# Patient Record
Sex: Female | Born: 1975
Health system: Southern US, Community
[De-identification: ages and names within clinical notes are randomized; demographics above are authoritative.]

## PROBLEM LIST (undated history)

## (undated) DIAGNOSIS — Z85828 Personal history of other malignant neoplasm of skin: Secondary | ICD-10-CM

## (undated) DIAGNOSIS — N926 Irregular menstruation, unspecified: Secondary | ICD-10-CM

## (undated) DIAGNOSIS — N879 Dysplasia of cervix uteri, unspecified: Secondary | ICD-10-CM

## (undated) DIAGNOSIS — C801 Malignant (primary) neoplasm, unspecified: Secondary | ICD-10-CM

## (undated) HISTORY — DX: Irregular menstruation, unspecified: N92.6

## (undated) HISTORY — DX: Personal history of other malignant neoplasm of skin: Z85.828

## (undated) HISTORY — DX: Dysplasia of cervix uteri, unspecified: N87.9

---

## 2000-05-22 ENCOUNTER — Encounter: Payer: Self-pay | Admitting: Physical Medicine and Rehabilitation

## 2000-05-22 ENCOUNTER — Ambulatory Visit (HOSPITAL_COMMUNITY): Admission: RE | Admit: 2000-05-22 | Discharge: 2000-05-22 | Payer: Self-pay | Admitting: Neurosurgery

## 2008-09-17 HISTORY — PX: APPENDECTOMY: SHX54

## 2008-09-26 ENCOUNTER — Ambulatory Visit: Payer: Self-pay | Admitting: Internal Medicine

## 2008-09-26 ENCOUNTER — Inpatient Hospital Stay: Payer: Self-pay | Admitting: Surgery

## 2010-02-08 DIAGNOSIS — C439 Malignant melanoma of skin, unspecified: Secondary | ICD-10-CM

## 2010-02-08 HISTORY — DX: Malignant melanoma of skin, unspecified: C43.9

## 2010-02-17 HISTORY — PX: MELANOMA EXCISION: SHX5266

## 2010-04-17 ENCOUNTER — Encounter: Payer: Self-pay | Admitting: Family Medicine

## 2010-04-19 HISTORY — PX: COLPOSCOPY: SHX161

## 2010-05-09 ENCOUNTER — Encounter: Payer: Self-pay | Admitting: Family Medicine

## 2010-05-10 DIAGNOSIS — D239 Other benign neoplasm of skin, unspecified: Secondary | ICD-10-CM

## 2010-05-10 HISTORY — DX: Other benign neoplasm of skin, unspecified: D23.9

## 2010-05-15 ENCOUNTER — Ambulatory Visit: Payer: Self-pay | Admitting: Family Medicine

## 2010-05-15 ENCOUNTER — Ambulatory Visit
Admission: RE | Admit: 2010-05-15 | Discharge: 2010-05-15 | Payer: Self-pay | Source: Home / Self Care | Attending: Family Medicine | Admitting: Family Medicine

## 2010-05-15 DIAGNOSIS — Z85828 Personal history of other malignant neoplasm of skin: Secondary | ICD-10-CM | POA: Insufficient documentation

## 2010-05-15 DIAGNOSIS — R21 Rash and other nonspecific skin eruption: Secondary | ICD-10-CM | POA: Insufficient documentation

## 2010-05-15 DIAGNOSIS — M533 Sacrococcygeal disorders, not elsewhere classified: Secondary | ICD-10-CM | POA: Insufficient documentation

## 2010-05-17 ENCOUNTER — Ambulatory Visit
Admission: RE | Admit: 2010-05-17 | Discharge: 2010-05-17 | Payer: Self-pay | Source: Home / Self Care | Attending: Family Medicine | Admitting: Family Medicine

## 2010-05-17 DIAGNOSIS — Z8742 Personal history of other diseases of the female genital tract: Secondary | ICD-10-CM | POA: Insufficient documentation

## 2010-05-21 LAB — CONVERTED CEMR LAB
ALT: 14 units/L (ref 0–35)
AST: 23 units/L (ref 0–37)
Albumin: 4.2 g/dL (ref 3.5–5.2)
Alkaline Phosphatase: 70 units/L (ref 39–117)
BUN: 14 mg/dL (ref 6–23)
Basophils Absolute: 0.1 10*3/uL (ref 0.0–0.1)
Basophils Relative: 1.3 % (ref 0.0–3.0)
Bilirubin, Direct: 0.1 mg/dL (ref 0.0–0.3)
CO2: 28 meq/L (ref 19–32)
Calcium: 9.3 mg/dL (ref 8.4–10.5)
Chloride: 102 meq/L (ref 96–112)
Cholesterol: 166 mg/dL (ref 0–200)
Creatinine, Ser: 0.9 mg/dL (ref 0.4–1.2)
Eosinophils Absolute: 0.4 10*3/uL (ref 0.0–0.7)
Eosinophils Relative: 6.8 % — ABNORMAL HIGH (ref 0.0–5.0)
GFR calc non Af Amer: 76.26 mL/min (ref >60.00)
Glucose, Bld: 84 mg/dL (ref 70–99)
HCT: 39 % (ref 36.0–46.0)
HDL: 53.4 mg/dL (ref >39.00)
Hemoglobin: 13.4 g/dL (ref 12.0–15.0)
LDL Cholesterol: 101 mg/dL — ABNORMAL HIGH (ref 0–99)
Lymphocytes Relative: 25.7 % (ref 12.0–46.0)
Lymphs Abs: 1.6 10*3/uL (ref 0.7–4.0)
MCHC: 34.2 g/dL (ref 30.0–36.0)
MCV: 93.3 fL (ref 78.0–100.0)
Monocytes Absolute: 0.5 10*3/uL (ref 0.1–1.0)
Monocytes Relative: 8 % (ref 3.0–12.0)
Neutro Abs: 3.6 10*3/uL (ref 1.4–7.7)
Neutrophils Relative %: 58.2 % (ref 43.0–77.0)
Platelets: 225 10*3/uL (ref 150.0–400.0)
Potassium: 4.2 meq/L (ref 3.5–5.1)
RBC: 4.18 M/uL (ref 3.87–5.11)
RDW: 13.1 % (ref 11.5–14.6)
Sodium: 139 meq/L (ref 135–145)
TSH: 1.15 microintl units/mL (ref 0.35–5.50)
Total Bilirubin: 0.7 mg/dL (ref 0.3–1.2)
Total CHOL/HDL Ratio: 3
Total Protein: 7.3 g/dL (ref 6.0–8.3)
Triglycerides: 58 mg/dL (ref 0.0–149.0)
VLDL: 11.6 mg/dL (ref 0.0–40.0)
WBC: 6.2 10*3/uL (ref 4.5–10.5)

## 2010-06-05 ENCOUNTER — Encounter: Payer: Self-pay | Admitting: Family Medicine

## 2010-06-21 NOTE — Assessment & Plan Note (Signed)
Summary: FOLLOW UP BEFORE END OF YEAR/RBH   Vital Signs:  Patient profile:   35 year old female Height:      61 inches Weight:      129 pounds BMI:     24.46 Temp:     98.4 degrees F oral Pulse rate:   72 / minute Pulse rhythm:   regular BP sitting:   108 / 60  (left arm) Cuff size:   regular  Vitals Entered By: Mervin Hack CMA Duncan Dull) (May 17, 2010 11:59 AM) CC: follow-up visit   History of Present Illness: here for follow up to review wellness labs and x ray  did coccyx x ray at last visit- normal  still sore from injury - never had a cyst or other skin change  is sore to sit for a long time  does not bother her enough to take med  slowly improves over time   referred to allergist for chronic skin rash-- will see Dr Burnard Hawthorne in [place]  still has a lot of itching  does keep nails cut very short  did take hydroxyzine last night    good wellness profile  lipids trig 58 and HDL 53 and LDL 101   saw gyn in nov-- colp was done/ has hpv had some dysplasia / pre cancerous cells  will be planning a leep on jan 20th        Allergies: 1)  ! Reglan  Past History:  Past Medical History: long cycle - menses every 6 weeks   Review of Systems General:  Denies fatigue, loss of appetite, and malaise. Eyes:  Denies blurring and eye irritation. CV:  Denies chest pain or discomfort, lightheadness, palpitations, and shortness of breath with exertion. Resp:  Denies cough, shortness of breath, and wheezing. GI:  Denies abdominal pain, change in bowel habits, indigestion, and nausea. GU:  Denies abnormal vaginal bleeding, dysuria, and urinary frequency. MS:  Denies joint pain, joint redness, joint swelling, and muscle weakness. Derm:  Complains of itching and rash; denies poor wound healing. Neuro:  Denies numbness and tingling. Psych:  Denies anxiety and depression. Endo:  Denies cold intolerance, excessive thirst, excessive urination, and heat intolerance. Heme:   Denies abnormal bruising and bleeding.  Physical Exam  General:  Well-developed,well-nourished,in no acute distress; alert,appropriate and cooperative throughout examination Head:  normocephalic, atraumatic, and no abnormalities observed.   Eyes:  vision grossly intact, pupils equal, pupils round, and pupils reactive to light.  no conjunctival pallor, injection or icterus  Ears:  R ear normal and L ear normal.   Nose:  no nasal discharge.   Mouth:  pharynx pink and moist.   Neck:  supple with full rom and no masses or thyromegally, no JVD or carotid bruit  Chest Wall:  No deformities, masses, or tenderness noted. Lungs:  Normal respiratory effort, chest expands symmetrically. Lungs are clear to auscultation, no crackles or wheezes. Heart:  Normal rate and regular rhythm. S1 and S2 normal without gallop, murmur, click, rub or other extra sounds. Abdomen:  Bowel sounds positive,abdomen soft and non-tender without masses, organomegaly or hernias noted. no renal bruits  Msk:  No deformity or scoliosis noted of thoracic or lumbar spine.  no acute joint changes  Pulses:  R and L carotid,radial,femoral,dorsalis pedis and posterior tibial pulses are full and equal bilaterally Extremities:  No clubbing, cyanosis, edema, or deformity noted with normal full range of motion of all joints.   Neurologic:  sensation intact to light touch, gait normal,  and DTRs symmetrical and normal.   Skin:  scabs diffusely on arms and legs some lentigos round nevus - pale and flat R leg erythematous rash on pelvic area still present  Cervical Nodes:  No lymphadenopathy noted Axillary Nodes:  No palpable lymphadenopathy Inguinal Nodes:  No significant adenopathy Psych:  normal affect, talkative and pleasant    Impression & Recommendations:  Problem # 1:  HEALTH MAINTENANCE EXAM (ICD-V70.0) Assessment Comment Only health mt reviewed health habits including diet, exercise and skin cancer prevention reviewed health  maintenance list and family history rev wellness labs in detail good chol  Td today  Problem # 2:  COCCYGEAL PAIN (ICD-724.79) Assessment: Unchanged coccyx pain after injury- nl x ray -gradual imp will continue to watch  consider ortho ref in future if any change   Problem # 3:  DYSPLASIA OF CERVIX UNSPECIFIED (ICD-622.10) Assessment: New for leep next month - is scheduled   Problem # 4:  SKIN RASH (ICD-782.1) Assessment: Unchanged likely allergic  for f/u with allergist next mo  Her updated medication list for this problem includes:    Triamcinolone Acetonide 0.1 % Crea (Triamcinolone acetonide) .Marland Kitchen... As needed  Complete Medication List: 1)  Triamcinolone Acetonide 0.1 % Crea (Triamcinolone acetonide) .... As needed 2)  Hydroxyzine Hcl 25 Mg Tabs (Hydroxyzine hcl) .... Take 1 by mouth once daily as needed 3)  Probiotic Caps (Probiotic product) .... Otc as directed. 4)  Vitamin D 1000 Unit Tabs (Cholecalciferol) .... Take 1 tablet by mouth once a day  Other Orders: TD Toxoids IM 7 YR + ([address]) Admin 1st Vaccine (60454)  Patient Instructions: 1)  labs look great -- keep up healthy diet and exercise  2)  follow up with the allergist as planned 3)  if your tailbone pain worsens - or changes or new symptoms-- please let me know - we would consider orthopedic consult    Orders Added: 1)  TD Toxoids IM 7 YR + [90714] 2)  Admin 1st Vaccine [90471] 3)  Est. Patient 18-39 years [99395]   Immunizations Administered:  Tetanus Vaccine:    Vaccine Type: Td    Site: left deltoid    Mfr: Sanofi Pasteur    Dose: 0.5 ml    Route: IM    Given by: Selena Batten Dance CMA (AAMA)    Exp. Date: 09/06/2011    Lot #: [id number]    VIS given: 04/06/08 version given May 17, 2010.   Immunizations Administered:  Tetanus Vaccine:    Vaccine Type: Td    Site: left deltoid    Mfr: Sanofi Pasteur    Dose: 0.5 ml    Route: IM    Given by: Selena Batten Dance CMA (AAMA)    Exp. Date: 09/06/2011     Lot #: [id number]    VIS given: 04/06/08 version given May 17, 2010.  Prior Medications: TRIAMCINOLONE ACETONIDE 0.1 % CREA (TRIAMCINOLONE ACETONIDE) as needed HYDROXYZINE HCL 25 MG TABS (HYDROXYZINE HCL) take 1 by mouth once daily as needed PROBIOTIC  CAPS (PROBIOTIC PRODUCT) OTC As directed. VITAMIN D 1000 UNIT  TABS (CHOLECALCIFEROL) Take 1 tablet by mouth once a day Current Allergies: ! REGLAN

## 2010-06-21 NOTE — Assessment & Plan Note (Signed)
Summary: NEW PT TO BE ESTABLISHED/JRR   Vital Signs:  Patient profile:   35 year old female Height:      61.5 inches Weight:      122.50 pounds BMI:     22.85 Temp:     98.3 degrees F oral Pulse rate:   80 / minute Pulse rhythm:   regular BP sitting:   106 / 70  (left arm) Cuff size:   regular  Vitals Entered By: Lewanda Rife LPN (May 15, 2010 11:25 AM) CC: New pt to be established   History of Present Illness: here to establish as a new patient did not have a primary care physcian  wanted to get estblashed     has hx of melanoma  oct 2011 -- was a mole on R leg - had stage @ and lymph nodes  sees oncol  / derm every 3 months   had another mole removed last week- pending result  froze 4 spots on her leg -- ? if wart or other   last pap and gyn exam 11/22 -- pap smear was fine  does have HPV  had colposcopy last week -- waiting on those results -- very small area bx  is on probiotics and vit D 3   has problems with itchy skin -- chronically  some random rashes -- last derm visit was clear (no facial involvement)  uses triamcinolone and atarax  ? if possibly wheat allergy  has cut out some breads  had allergy patch testing on back when pregnant- was allergic to plastic product  right now has allergy R groin   no blood panel recently  last chol last year -- was ok (has family hx)   has some back pain -- ever since going over a big bump on a sled - hurt her tail bone is still sore from that -- hard to rock back on her pelvis  almost 2 years   Preventive Screening-Counseling & Management  Alcohol-Tobacco     Smoking Status: never  Caffeine-Diet-Exercise     Does Patient Exercise: yes      Drug Use:  no.    Allergies (verified): 1)  ! 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  Physical Exam  Skin:  erythema 3-4 cm slt raised and uniform L groin area    Impression & Recommendations:  Problem # 1:  COCCYGEAL PAIN (ICD-724.79) after injury 2 y ago with pain on sitting and tenderness x ray today Orders: T-Coccyx/Sacrum 2 Views ([address]UE)  Problem # 2:  SKIN RASH (ICD-782.1)  2 years of intermittent rash suspects allergy ref to allergist  Orders: Allergy Referral  (Allergy)  Her updated  medication list for this problem includes:    Triamcinolone Acetonide 0.1 % Crea (Triamcinolone acetonide) .Marland Kitchen... As needed  Problem # 3:  SCREENING FOR LIPOID DISORDERS (ICD-V77.91) labs today f/u to disc  Orders: TLB-Lipid Panel (80061-LIPID)  Complete Medication List: 1)  Triamcinolone Acetonide 0.1 % Crea (Triamcinolone acetonide) .... As needed 2)  Hydroxyzine Hcl 25 Mg Tabs (Hydroxyzine hcl) .... Take 1 by mouth once daily as needed 3)  Probiotic Caps (Probiotic product) .... Otc as directed. 4)  Vitamin D 1000 Unit Tabs (Cholecalciferol) .... Take 1 tablet by mouth once a day  Other Orders: Venipuncture ([address]) TLB-BMP (Basic Metabolic Panel-BMET) (80048-METABOL) TLB-CBC Platelet - w/Differential (85025-CBCD) TLB-Hepatic/Liver Function Pnl (80076-HEPATIC) TLB-TSH (Thyroid Stimulating Hormone)  (60454-UJW)  Patient Instructions: 1)  we will do allergist referral at check out  2)  we will do x ray today  3)  labs today for wellness 4)  follow up before first of the year if possible -- to review labs    Orders Added: 1)  Venipuncture [36415] 2)  TLB-Lipid Panel [80061-LIPID] 3)  TLB-BMP (Basic Metabolic Panel-BMET) [80048-METABOL] 4)  TLB-CBC Platelet - w/Differential [85025-CBCD] 5)  TLB-Hepatic/Liver Function Pnl [80076-HEPATIC] 6)  TLB-TSH (Thyroid Stimulating Hormone) [84443-TSH] 7)  Allergy Referral  [Allergy] 8)  T-Coccyx/Sacrum 2 Views [72220TC]    Current Allergies (reviewed today): ! REGLAN  Appended Document: NEW PT TO BE ESTABLISHED/JRR     Allergies: 1)  ! Reglan  Review of Systems General:  Denies fatigue, fever, and loss of appetite. Eyes:  Denies blurring and eye irritation. CV:  Denies chest pain or discomfort, lightheadness, palpitations, and shortness of breath with exertion. Resp:  Denies cough, shortness of breath, and wheezing. GI:  Denies abdominal pain, change in bowel habits, and indigestion. GU:  Denies dysuria and urinary frequency. MS:  Denies joint pain, joint redness, and joint swelling. Derm:  Complains of itching and rash; denies lesion(s) and poor wound healing. Neuro:  Denies numbness and tingling. Psych:  Denies anxiety and depression. Heme:  Denies abnormal bruising and bleeding.  Physical Exam  General:  Well-developed,well-nourished,in no acute distress; alert,appropriate and cooperative throughout examination Head:  normocephalic, atraumatic, and no abnormalities observed.   Eyes:  vision grossly intact, pupils equal, pupils round, and pupils reactive to light.  no conjunctival pallor, injection or icterus  Mouth:  pharynx pink and moist.   Neck:  supple with full rom and no masses or thyromegally, no JVD or carotid bruit  Chest Wall:  No deformities, masses, or tenderness noted. Lungs:  Normal respiratory effort,  chest expands symmetrically. Lungs are clear to auscultation, no crackles or wheezes. Heart:  Normal rate and regular rhythm. S1 and S2 normal without gallop, murmur, click, rub or other extra sounds. Abdomen:  Bowel sounds positive,abdomen soft and non-tender without masses, organomegaly or hernias noted. no renal bruits  Msk:  No deformity or scoliosis noted of thoracic or lumbar spine.  no acute joint changes  mild tenderness over coccyx area without skin change Pulses:  R and L carotid,radial,femoral,dorsalis pedis and posterior tibial pulses are full and equal bilaterally Extremities:  No clubbing, cyanosis, edema, or deformity noted with normal full range of motion of all joints.   Neurologic:  sensation intact to light touch, gait normal, and DTRs symmetrical and normal.   Skin:  diffuse scabbing   Cervical Nodes:  No lymphadenopathy noted Inguinal Nodes:  No significant adenopathy Psych:  normal affect, talkative and pleasant    Complete Medication List: 1)  Triamcinolone Acetonide 0.1 % Crea (Triamcinolone acetonide) .... As needed 2)  Hydroxyzine Hcl 25 Mg Tabs (Hydroxyzine hcl) .... Take 1 by mouth once daily as needed 3)  Probiotic Caps (Probiotic product) .... Otc as directed. 4)  Vitamin D 1000 Unit Tabs (Cholecalciferol) .... Take 1 tablet by mouth once a day   Orders Added: 1)  New Patient Level III [[address]]

## 2010-06-21 NOTE — Letter (Signed)
Summary: [facility]   Imported By: Maryln Gottron 06/12/2010 10:38:51  _____________________________________________________________________  External Attachment:    Type:   Image     Comment:   External Document

## 2010-06-21 NOTE — Letter (Signed)
Summary: [facility]  [facility]   Imported By: Maryln Gottron 06/12/2010 10:37:30  _____________________________________________________________________  External Attachment:    Type:   Image     Comment:   External Document

## 2010-06-21 NOTE — Letter (Signed)
Summary: Patient Questionnaire  Patient Questionnaire   Imported By: Beau Fanny 05/15/2010 14:45:56  _____________________________________________________________________  External Attachment:    Type:   Image     Comment:   External Document

## 2010-07-17 NOTE — Miscellaneous (Signed)
Summary: Allergy  Allergy   Imported By: Kassie Mends 07/02/2010 08:52:59  _____________________________________________________________________  External Attachment:    Type:   Image     Comment:   External Document

## 2011-01-28 ENCOUNTER — Encounter: Payer: Self-pay | Admitting: Family Medicine

## 2011-01-28 ENCOUNTER — Telehealth: Payer: Self-pay | Admitting: Family Medicine

## 2011-01-28 ENCOUNTER — Ambulatory Visit (INDEPENDENT_AMBULATORY_CARE_PROVIDER_SITE_OTHER): Payer: BC Managed Care – PPO | Admitting: Family Medicine

## 2011-01-28 VITALS — BP 122/74 | HR 84 | Temp 97.9°F | Ht 62.0 in | Wt 136.0 lb

## 2011-01-28 DIAGNOSIS — R51 Headache with orthostatic component, not elsewhere classified: Secondary | ICD-10-CM

## 2011-01-28 NOTE — Telephone Encounter (Signed)
Spoke with patient and she is positive of no pregnancy. She started her menses today.

## 2011-01-28 NOTE — Telephone Encounter (Signed)
Noted. Thanks.

## 2011-01-28 NOTE — Patient Instructions (Signed)
I think you have low CSF pressure headache. I would like to obtain MRI to help diagnose this. In meantime, please follow bedrest for next few days, may use caffeine to help with pain or continued advil. If any worsening despite bedrest, or fever >101.5, you may need to seek urgent care. Pass by Marion's office to set this up.

## 2011-01-28 NOTE — Telephone Encounter (Signed)
Can we call to ensure no chance of being pregnant prior to MRI with contrast?  I forgot to discuss at OV.  Make sure having regular periods and find LMP (h/o q6wk cycles)

## 2011-01-28 NOTE — Assessment & Plan Note (Addendum)
New onset headache in pt with no h/o migraines in past or significant HA in past. Story consistent with low CSF pressure headache, will obtain MRI w/ w/out contrast to further evaluate for diffuse meningeal enhancement or brain sagging, r/o other cause of HA, if abnl may refer for blood patch. If normal and not improving, refer to HA wellness center. In meantime, conservative therapy with bed rest and generous caffeine intake. Discussed red flags to seek urgent care.

## 2011-01-28 NOTE — Progress Notes (Signed)
Subjective:    Patient ID: Rebecca Peters, female    DOB: 1976/05/15, 35 y.o.   MRN: [id number]  HPI CC: headache  Woke up Saturday morning with bad headache, severe.  Any time she stands up feels worse, associated with nausea.  No emesis.  As long as laying down feels ok.  When stands up feels much worse.  Took 2 advil 1 1/2 hrs ago.  HA described as throbbing pain, VERY positional worse with upright.  Does feel back and upper shoulder stiffness.  Had milder HA Friday during day, but improved with caffeine and able to go to work.  No vision changes, dizziness, fevers/chills, abd pain.  No neck stiffness.  Thursday night felt tightness in chest and across neck/back/shoulders.   No h/o migraines in past.  No smokers at home.  Medications and allergies reviewed and updated in chart.  Past histories reviewed and updated if relevant as below. Patient Active Problem List  Diagnoses  . DYSPLASIA OF CERVIX UNSPECIFIED  . COCCYGEAL PAIN  . SKIN RASH  . SKIN CANCER, HX OF   Past Medical History  Diagnosis Date  . Late menses     long cycle---menses every 6 weeks  . Personal history of other malignant neoplasm of skin   . Dysplasia of cervix, unspecified    Past Surgical History  Procedure Date  . Appendectomy 5/10  . Melanoma excision 10/11    lymphnodes stage 2; wide excision with 2 LN removed  . Colposcopy 12/11   History  Substance Use Topics  . Smoking status: Never Smoker   . Smokeless tobacco: Not on file  . Alcohol Use: Yes     1 drink every few months   Family History  Problem Relation Age of Onset  . Hyperlipidemia Mother   . Hyperlipidemia Father   . Hypertension Father   . Cancer Neg Hx    Allergies  Allergen Reactions  . Metoclopramide Hcl     REACTION: trouble breathing and muscle spasms to face   No current outpatient prescriptions on file prior to visit.   Review of Systems Per HPI    Objective:   Physical Exam  Nursing note and vitals  reviewed. Constitutional: She appears well-developed and well-nourished. She appears distressed (when upright, not when supine on exam table).  HENT:  Head: Normocephalic and atraumatic.  Right Ear: External ear normal.  Left Ear: External ear normal.  Nose: Nose normal.  Mouth/Throat: Oropharynx is clear and moist. No oropharyngeal exudate.  Eyes: Conjunctivae and EOM are normal. Pupils are equal, round, and reactive to light. No scleral icterus.  Neck: Normal range of motion. Neck supple.  Cardiovascular: Normal rate, regular rhythm, normal heart sounds and intact distal pulses.   No murmur heard. Pulmonary/Chest: Effort normal and breath sounds normal. No respiratory distress. She has no wheezes. She has no rales.  Musculoskeletal: Normal range of motion. She exhibits no edema.  Lymphadenopathy:    She has no cervical adenopathy.  Neurological: She has normal strength. No cranial nerve deficit or sensory deficit. She exhibits normal muscle tone. Coordination and gait normal.  Reflex Scores:      Bicep reflexes are 2+ on the right side and 2+ on the left side.      Patellar reflexes are 2+ on the right side and 2+ on the left side.      FTN normal. CN2-12 intact. No nystagmus. Headache recurs when standing or sitting up, ameliorated with laying supine  Skin: Skin is warm  and dry. No rash noted.  Psychiatric: She has a normal mood and affect.          Assessment & Plan:

## 2011-01-29 ENCOUNTER — Encounter: Payer: Self-pay | Admitting: Family Medicine

## 2011-01-29 ENCOUNTER — Ambulatory Visit: Payer: Self-pay | Admitting: Family Medicine

## 2011-01-29 ENCOUNTER — Telehealth: Payer: Self-pay | Admitting: Family Medicine

## 2011-01-29 DIAGNOSIS — R51 Headache with orthostatic component, not elsewhere classified: Secondary | ICD-10-CM

## 2011-01-29 NOTE — Telephone Encounter (Addendum)
Spoke with pt, discussed normal results of MRI. Will set up with HA center. Can we try and set her up this week if possible.  Please fax MRI report as well as last note.

## 2011-02-01 ENCOUNTER — Ambulatory Visit (INDEPENDENT_AMBULATORY_CARE_PROVIDER_SITE_OTHER): Payer: BC Managed Care – PPO | Admitting: Neurology

## 2011-02-01 ENCOUNTER — Ambulatory Visit
Admission: RE | Admit: 2011-02-01 | Discharge: 2011-02-01 | Disposition: A | Discharge status: Home / Self Care | Payer: BC Managed Care – PPO | Source: Ambulatory Visit | Attending: Neurology | Admitting: Neurology

## 2011-02-01 ENCOUNTER — Other Ambulatory Visit: Payer: Self-pay | Admitting: Neurology

## 2011-02-01 ENCOUNTER — Telehealth: Payer: Self-pay | Admitting: Family Medicine

## 2011-02-01 VITALS — BP 96/64 | HR 68 | Ht 62.0 in | Wt 133.0 lb

## 2011-02-01 DIAGNOSIS — R51 Headache with orthostatic component, not elsewhere classified: Secondary | ICD-10-CM

## 2011-02-01 MED ORDER — IOHEXOL 180 MG/ML  SOLN
1.0000 mL | Freq: Once | INTRAMUSCULAR | Status: AC | PRN
  Administered 2011-02-01: 1 mL via EPIDURAL

## 2011-02-01 NOTE — Telephone Encounter (Signed)
Noted thanks. Spoke with Dr. Modesto Charon.  Consistent with spontaneous low CSF pressure HA, will set up blood patch this PM.   Pt to call him back with update. Appreciate neuro care of pt.

## 2011-02-01 NOTE — Patient Instructions (Signed)
Please go to [facility] at [address] at 1:15 pm today for the blood patch.

## 2011-02-01 NOTE — Telephone Encounter (Signed)
See future notes.

## 2011-02-01 NOTE — Progress Notes (Signed)
Dear Dr. Sharen Hones,  Thank you for having me see Ms. Rebecca Peters in consultation today for her orthostatic headache.  As you may recall, she is a 35 year old woman who 1 week ago began developing orthostatic headache.  She describes it as mainly posterior radiating forward.  It comes on within seconds of standing up.  There is no photophobia or phonophobia.  It is helped my caffeine and fluids, but not completely alleviated.  She denies injury, valsalva, coughing before it began.  She has not had a lumbar puncture or lumbar procedure.  She does say that it may be slightly better.  However, she has a feeling of muffled hearing that has begun.  She denies rhinorrhea.  PMhx:  No history of migraine headaches.  No history of signficant trauma.  SocHx:  Active.  No tob, social EtOH.  FamHx:  No history of similar headaches.  ROS:  13 systems were reviewed and are negative for visual complaints.  No other neurologic symptoms.  Other ROS negative.  Exam:      Sitting  Lying   standing Filed Vitals:   02/01/11 0940 02/01/11 0946 02/01/11 0949 02/01/11 0951  BP:  84/64 98/68 96/64   Pulse:  64 68 68  Height: 5\' 2"  (1.575 m)     Weight: 133 lb (60.328 kg)      Gen:  Well appearing woman who insists on lying down.  Cardiovascular: The patient has a regular rate and rhythm and no carotid bruits.  Fundoscopy:  Disks are flat. Vessel caliber within normal limits.  Normal venous pulsations.  Mental status:   The patient is oriented to person, place and time. Recent and remote memory are intact. Attention span and concentration are normal. Language including repetition, naming, following commands are intact. Fund of knowledge of current and historical events, as well as vocabulary are normal.  Cranial Nerves: Pupils are equally round and reactive to light. Visual fields full to confrontation. Extraocular movements are intact without nystagmus. Facial sensation and muscles of mastication are  intact. Muscles of facial expression are symmetric. Hearing intact to bilateral finger rub. Tongue protrusion, uvula, palate midline.  Shoulder shrug intact  Motor:  The patient has normal bulk and tone, no pronator drift and 5/5 strength bilaterally.  There are no adventitious movements.  Reflexes:  Are 2+ bilaterally in both the upper and lower extremities.    Coordination:  Normal finger to nose.  No dysdiadokinesia.  Sensation is intact to temperature and vibration.  Gait and Station are normal.    Impression:  Orthostatic headache likely due to spontaneous CSF hypotension.  Recs: I am going to consult neuro-radiology for a blood patch.  I also need to get the imaging from [place].  We may need to imaging of the spine to look for the site of the leak, but I will discuss with with neuro-rads.  I will follow up with the patient as soon as possible about when we can get her in for the blood patch.  I agree with continuing caffeine and fluids.  Thank you for having [name] see this patient in consultation.  Feel free to contact me with any questions.  Lupita Raider Modesto Charon, MD [facility], [place] [address] [place], [place] [address] Phone: [phone] Fax: [phone].    - last Friday a.m. - tighttenss in chest and back of shoulders, - within seconds of standing up - as soon as head - no precipitating event - no fluid dripping from  nose   - PMHx:  Occasional headache  ROS: clogged ears, no fevers chills weight loss

## 2011-02-01 NOTE — Telephone Encounter (Signed)
Patient called to say that she still wasn't feeling well and she did want to proceed with seeing a Neurologist. She says she still has a headache and has a lot of tension in her neck. Told her we would call to see if Dr Modesto Charon could see her today and would call her back. After calling, Dr Modesto Charon had a cancellation this am at 9:30am. Scheduled the appt and called Jacquelyn back, she will go to the appt. Please put a referral back in for Neurology.

## 2011-02-07 ENCOUNTER — Encounter: Payer: Self-pay | Admitting: Neurology

## 2011-02-07 ENCOUNTER — Encounter: Payer: Self-pay | Admitting: Family Medicine

## 2011-02-07 ENCOUNTER — Ambulatory Visit (INDEPENDENT_AMBULATORY_CARE_PROVIDER_SITE_OTHER): Payer: BC Managed Care – PPO | Admitting: Neurology

## 2011-02-07 ENCOUNTER — Ambulatory Visit (INDEPENDENT_AMBULATORY_CARE_PROVIDER_SITE_OTHER): Payer: BC Managed Care – PPO | Admitting: Family Medicine

## 2011-02-07 VITALS — BP 98/60 | HR 72 | Wt 135.0 lb

## 2011-02-07 DIAGNOSIS — R51 Headache with orthostatic component, not elsewhere classified: Secondary | ICD-10-CM

## 2011-02-07 MED ORDER — NAPROXEN 500 MG PO TABS: 500.0000 mg | ORAL_TABLET | Freq: Two times a day (BID) | ORAL | Status: AC | PRN

## 2011-02-07 MED ORDER — PROCHLORPERAZINE MALEATE 5 MG PO TABS
10.0000 mg | ORAL_TABLET | Freq: Four times a day (QID) | ORAL | Status: AC | PRN
Start: 2011-02-07 — End: 2011-02-14

## 2011-02-07 NOTE — Assessment & Plan Note (Signed)
Does not appear nor symptomatically describe her prior orthostatic headaches. But temporal association and repeatedness of these "new headaches" influence me to have her seen again. This does not seem to have migrainous attributes, nor tension. Woukld have prescribed Phenergan but since she is being seen early this afternoon, will hold that. Pt has appt in place of cancellation at 2:30 today with Dr Modesto Charon.

## 2011-02-07 NOTE — Patient Instructions (Signed)
Keep appt with Dr Modesto Charon today. Try Tyl if needed before being seen. Keep fluid intake up.

## 2011-02-07 NOTE — Progress Notes (Signed)
  Subjective:    Patient ID: Rebecca Peters, female    DOB: 1975-08-22, 35 y.o.   MRN: [id number]  HPI Pt of Dr Royden Purl here as acute appt for nausea, vomitting and diarrhea. She recently saw Dr Modesto Charon for orthostatic headache and is looking into doing a blood patch for suspected occult CSF leak.  She is now having a different headache than last week but she gets nauseous and sick to her stomach and then the nauseous feeling gets better but she still has headache thereafter.  She had a blood patch done on Friday and felt great afterwards. She felt fine all weekend except for soreness in the back where they did the injection, but headaches were resolved. Her sxs now are when awakening the last two days, she has nausea and headache ease in together. She walked her dog both mornings and was assymptomatic while walking the dog. Then she slowly developed the nausea and headache. She had already eaten her bfst when this began. She took two Advil with the onset of headache.  Last week her headache was in the back of the neck and would rise. This new headache is now bitemporal and retro orbital. She is not congested and has no rhinitis or cough. She has taken IBP both mornings and has had nausea both mornings with vomitting today and stools looser than usual but not diarrheal.  She has some problems with feeling upset with visual sxs altho reading in the office here did not bother her. Lateral following does make her uneasy. Her headache is bilateral. HEadache when seen was 4-5/10, has been max of 8/10 and has resolved at times. Yest H/a resolved about an hour after taking Advil, this AM, headache returns with getting up. Her headaches last week were instantly relieved with laying down, this weeks is not resolved like that.    Review of Systems  Constitutional: Negative for fever, chills, diaphoresis, activity change and fatigue.  HENT: Negative for ear pain, congestion, rhinorrhea and postnasal drip.     Eyes: Negative for redness.  Respiratory: Negative for cough, chest tightness, shortness of breath and wheezing.   Cardiovascular: Negative for chest pain.  Neurological: Positive for headaches (see HPI). Negative for dizziness (but slow moving), tremors, seizures, syncope, facial asymmetry, speech difficulty, weakness, light-headedness (see Dizziness) and numbness.       Objective:   Physical Exam  Constitutional: She is oriented to person, place, and time. She appears well-developed and well-nourished. No distress.  HENT:  Head: Normocephalic and atraumatic.  Right Ear: External ear normal.  Left Ear: External ear normal.  Nose: Nose normal.  Mouth/Throat: Oropharynx is clear and moist. No oropharyngeal exudate.  Eyes: Conjunctivae and EOM are normal. Pupils are equal, round, and reactive to light.  Neck: Normal range of motion. Neck supple. No thyromegaly present.  Cardiovascular: Normal rate, regular rhythm and normal heart sounds.   Pulmonary/Chest: Effort normal and breath sounds normal. She has no wheezes. She has no rales.  Lymphadenopathy:    She has no cervical adenopathy.  Neurological: She is alert and oriented to person, place, and time. She has normal reflexes. She displays normal reflexes. No cranial nerve deficit. She exhibits normal muscle tone. Coordination normal.       Pt looks anxious with testing EOMs as if will worsen sxs or cause problem, esp when doing lateral gaze.  Skin: She is not diaphoretic.          Assessment & Plan:

## 2011-02-07 NOTE — Progress Notes (Signed)
Dear Dr. Milinda Antis,  I saw Rebecca Peters back in follow up today for her problem with what I think is a orthostatic headache from a spontaneous CSF leak.  As you may recall she is a 35 year old woman who I initially saw 1 week ago.  I sent her for a blood patch and she had an immediate response with complete remission of her headache after it was done.  Unfortunately she returns today reporting that on Tuesday it returned accompanied by nausea and vomiting.  It does not seem to have as clear a relationship to position currently.  However, she does feel it gets better when she lies down, but it is not immediate.  It does not seem to immediately hurt her when she gets up.  She finds it is worse in the morning, typically accompanied by nausea.  She has been treating it with ibuprofen or acetominophen.  Past medical, Social, Family, Medications and Allergies were reviewed and are unchanged.  ROS: 13 systems were reviewed and are notable for continued neck stiffness.  Other ROS were negative.  Exam: Filed Vitals:   02/07/11 1431  BP: 98/60  Pulse: 72    Cardiovascular: The patient has a regular rate and rhythm and no carotid bruits.  Fundoscopy:  Disks are flat. Vessel caliber within normal limits.  Mental status:   The patient is oriented to person, place and time. Recent and remote memory are intact. Attention span and concentration are normal. Language including repetition, naming, following commands are intact. Fund of knowledge of current and historical events, as well as vocabulary are normal.  Cranial Nerves: Pupils are equally round and reactive to light. Visual fields full to confrontation. Extraocular movements are intact without nystagmus. Facial sensation and muscles of mastication are intact. Muscles of facial expression are symmetric. Hearing intact to bilateral finger rub. Tongue protrusion, uvula, palate midline.  Shoulder shrug intact  Motor:  The patient has normal bulk and tone, no  pronator drift and 5/5 strength bilaterally.  There are no adventitious movements.  Reflexes:  Are 2+ bilaterally in both the upper and lower extremities.    Coordination:  Normal finger to nose.  No dysdiadokinesia.  Sensation is intact to touch.  Gait and Station are normal.  Tandem gait is intact.  Romberg is negative  I reviewed her MRI of her brain and I was not struck by extremely low lying tonsils. However, I am going to get my colleagues in neuro-rads to review it.  Impression:  Likely continued headache from spontaneous leak.  However, I am hopeful it will resolve with conservative treatment.  I have advised her to use naproxen 500 bid and compazine 5-10mg  q6h prn for the nausea.  She is to stay well hydrated.  If she continues to have problems I will talk to neurorads about a repeat blood patch.  If this does not help her I will consider sending her to [facility] for the low pressure headache clinic.  She is going to call me Monday to let me know how she is doing.  Rebecca Peters Modesto Charon, MD [facility], [place]

## 2011-02-07 NOTE — Patient Instructions (Signed)
Your prescriptions have been sent to your pharmacy. Call us on Monday to let us know how you are feeling.

## 2011-02-21 ENCOUNTER — Telehealth: Payer: Self-pay | Admitting: Neurology

## 2011-02-21 NOTE — Telephone Encounter (Signed)
Pt is experiencing a new headache and problems with her eyes. She isn't sure if this is related to her previous orthostatic headache. She would like a return call to advise her on what to do.

## 2011-02-21 NOTE — Telephone Encounter (Signed)
please book ms. Goodine for f/u at 10:00 o'clock this Monday.

## 2011-02-25 ENCOUNTER — Ambulatory Visit (INDEPENDENT_AMBULATORY_CARE_PROVIDER_SITE_OTHER): Payer: BC Managed Care – PPO | Admitting: Neurology

## 2011-02-25 ENCOUNTER — Encounter: Payer: Self-pay | Admitting: Neurology

## 2011-02-25 VITALS — BP 90/60 | HR 76 | Wt 135.0 lb

## 2011-02-25 DIAGNOSIS — R51 Headache with orthostatic component, not elsewhere classified: Secondary | ICD-10-CM

## 2011-02-25 NOTE — Progress Notes (Signed)
Dear Dr. Milinda Antis,  I saw  Rebecca Peters back in [facility] for her problem with eye complaints after  possible spontaneous intracranial hypotension treated with blood patch. Her headache has remained improved but she continues to have complaints of fullness in her right ear with difficulty hearing along with pain across her upper back as well as a new complaint of approximately 2 weeks in duration of pain when she moves her eyes into eccentric gaze.  She describes it as a sharp shooting pain.  Today it is slightly better.  Not clear if it is worse or better with lying down.  She also describes her ear feeling clogged up -- and a feeling of warmth in her ear when she drinks warm fluids.  The ear clogging was helped by a chiropractor, but does continue to bother her.  Medical history, social history, family history, medications and allergies were reviewed and have not changed since the last clinic vist.  ROS:  13 systems were reviewed and are notable for feeling of weakness in the arms and legs.  All other review of systems are unremarkable.  Exam: . Filed Vitals:   02/25/11 1012  BP: 90/60  Pulse: 76  Weight: 135 lb (61.236 kg)    In general, well appearing woman in NAD.  Mental status:   The patient is oriented to person, place and time. Recent and remote memory are intact. Attention span and concentration are normal. Language including repetition, naming, following commands are intact. Fund of knowledge of current and historical events, as well as vocabulary are normal.  Cranial Nerves: Pupils are equally round and reactive to light. VA 20/25 OD, 20/20 OS. Visual fields full to confrontation. Extraocular movements are intact without nystagmus. Maddox rod reveals a comitant exodeviation.  Facial sensation and muscles of mastication are intact. Muscles of facial expression are symmetric. Right ear drum does not look fluid filled. Tongue protrusion, uvula, palate midline.   Shoulder shrug intact   Gait:  Normal gait and station.    Impression:  I think the pain with eye movement may be sequalae of her low pressure headache, possibly from irritation of the meninges that is thought to be a residual of intracranial hypotension, although I am not sure.  The fullness in the ear has been described in intracranial hypotension but it is possible this represents another process.    Recommendations: 1. Fullness in the ear - I would recommend a referral to an ENT to see if this is because of a middle ear problem - if you could take care of that that would be great. 2. Eye pain - We can consider another blood patch.  It may be difficult to convince neuro-rads but she will call me near the end of the week if things are not better and I will try to set it up.   Lupita Raider Modesto Charon, MD [facility], [place]

## 2011-03-03 NOTE — Progress Notes (Signed)
Please let pt know that Dr Modesto Charon wants me to set her up with ENT to check out the ear fullness she is having  Let me know if agreeable and if she wants to go to [facility] or [place]-thanks

## 2011-03-22 ENCOUNTER — Encounter: Payer: Self-pay | Admitting: Family Medicine

## 2011-03-22 ENCOUNTER — Ambulatory Visit (INDEPENDENT_AMBULATORY_CARE_PROVIDER_SITE_OTHER): Payer: BC Managed Care – PPO | Admitting: Family Medicine

## 2011-03-22 DIAGNOSIS — Z0000 Encounter for general adult medical examination without abnormal findings: Secondary | ICD-10-CM

## 2011-03-22 DIAGNOSIS — R7309 Other abnormal glucose: Secondary | ICD-10-CM | POA: Insufficient documentation

## 2011-03-22 DIAGNOSIS — Z13220 Encounter for screening for lipoid disorders: Secondary | ICD-10-CM | POA: Insufficient documentation

## 2011-03-22 DIAGNOSIS — Z131 Encounter for screening for diabetes mellitus: Secondary | ICD-10-CM

## 2011-03-22 LAB — LIPID PANEL
HDL: 49 mg/dL (ref >39)
LDL Cholesterol: 69 mg/dL (ref 0–99)
Triglycerides: 136 mg/dL (ref <150)

## 2011-03-22 NOTE — Progress Notes (Signed)
Subjective:    Patient ID: Rebecca Peters, female    DOB: 1975-11-09, 35 y.o.   MRN: [id number]  HPI Here to get her biometric screening form filled out / annual health mt   Wt is stable with bmi of 25  bp 88/62  Needs labs for April or later  Need cholesterol and glucose   Has been feeling good overall   Had a bout with a spinal headache in the late summer  Had to have a blood patch done once  Took a while and now feeling better - almost had to have it done again Did go to a chiropractor- helped pain in neck  Has not had a flu shot this year  Does not want one - is nervous about it with headache  Td is up to date 12/11   Saw gyn in end of aug - nl pap No mammogram yet   Patient Active Problem List  Diagnoses  . DYSPLASIA OF CERVIX UNSPECIFIED  . COCCYGEAL PAIN  . SKIN RASH  . SKIN CANCER, HX OF  . Orthostatic headache  . Screening for lipoid disorders  . Diabetes mellitus screening   Past Medical History  Diagnosis Date  . Late menses     long cycle---menses every 6 weeks  . Personal history of other malignant neoplasm of skin   . Dysplasia of cervix, unspecified    Past Surgical History  Procedure Date  . Appendectomy 5/10  . Melanoma excision 10/11    lymphnodes stage 2; wide excision with 2 LN removed  . Colposcopy 12/11   History  Substance Use Topics  . Smoking status: Never Smoker   . Smokeless tobacco: Never Used  . Alcohol Use: Yes     1 drink every few months   Family History  Problem Relation Age of Onset  . Hyperlipidemia Mother   . Hyperlipidemia Father   . Hypertension Father   . Cancer Neg Hx    Allergies  Allergen Reactions  . Metoclopramide Hcl     REACTION: trouble breathing and muscle spasms to face   Current Outpatient Prescriptions on File Prior to Visit  Medication Sig Dispense Refill  . cholecalciferol (VITAMIN D) 1000 UNITS tablet Take 1,000 Units by mouth daily.        . naproxen (NAPROSYN) 500 MG tablet Take 1  tablet (500 mg total) by mouth every 12 (twelve) hours as needed (headache).  60 tablet  2  . PROBIOTIC CAPS Take by mouth as directed.              Review of Systems Review of Systems  Constitutional: Negative for fever, appetite change, fatigue and unexpected weight change.  Eyes: Negative for pain and visual disturbance.  Respiratory: Negative for cough and shortness of breath.   Cardiovascular: Negative for cp or palpitations    Gastrointestinal: Negative for nausea, diarrhea and constipation.  Genitourinary: Negative for urgency and frequency.  Skin: Negative for pallor or rash   Neurological: Negative for weakness, light-headedness, numbness and headaches.  Hematological: Negative for adenopathy. Does not bruise/bleed easily.  Psychiatric/Behavioral: Negative for dysphoric mood. The patient is not nervous/anxious.          Objective:   Physical Exam  Constitutional: She appears well-developed and well-nourished. No distress.  HENT:  Head: Normocephalic and atraumatic.  Right Ear: External ear normal.  Left Ear: External ear normal.  Nose: Nose normal.  Mouth/Throat: Oropharynx is clear and moist.  Eyes: Conjunctivae and EOM are normal. Pupils  are equal, round, and reactive to light. No scleral icterus.  Neck: Normal range of motion. Neck supple. No JVD present. Carotid bruit is not present. No thyromegaly present.  Cardiovascular: Normal rate, regular rhythm, normal heart sounds and intact distal pulses.  Exam reveals no gallop.   Pulmonary/Chest: Effort normal and breath sounds normal. No respiratory distress. She has no wheezes. She exhibits no tenderness.  Abdominal: Soft. Bowel sounds are normal. She exhibits no distension and no mass. There is no tenderness.  Musculoskeletal: Normal range of motion. She exhibits no edema and no tenderness.  Lymphadenopathy:    She has no cervical adenopathy.  Neurological: She is alert. She has normal reflexes. No cranial nerve  deficit. She exhibits normal muscle tone. Coordination normal.  Skin: Skin is warm and dry. No rash noted. No erythema. No pallor.  Psychiatric: She has a normal mood and affect.       Pleasant and talkative           Assessment & Plan:

## 2011-03-22 NOTE — Patient Instructions (Signed)
Labs today Then we will mail your form  Stay healthy

## 2011-03-24 DIAGNOSIS — Z0000 Encounter for general adult medical examination without abnormal findings: Secondary | ICD-10-CM | POA: Insufficient documentation

## 2011-03-24 NOTE — Assessment & Plan Note (Signed)
Reviewed health habits including diet and exercise and skin cancer prevention Also reviewed health mt list, fam hx and immunizations   Labs today Biometric form filled out

## 2011-03-24 NOTE — Assessment & Plan Note (Signed)
Labs today for lipid profile  Expect good results with good diet  Then record on form for biometric profile

## 2011-03-24 NOTE — Assessment & Plan Note (Signed)
Check labs for her wellness and also biometric panel  Will return form when her results return

## 2012-04-07 ENCOUNTER — Ambulatory Visit (INDEPENDENT_AMBULATORY_CARE_PROVIDER_SITE_OTHER): Payer: BC Managed Care – PPO | Admitting: Family Medicine

## 2012-04-07 ENCOUNTER — Encounter: Payer: Self-pay | Admitting: Family Medicine

## 2012-04-07 VITALS — BP 102/66 | HR 60 | Temp 98.2°F | Ht 62.0 in | Wt 145.2 lb

## 2012-04-07 DIAGNOSIS — Z0000 Encounter for general adult medical examination without abnormal findings: Secondary | ICD-10-CM

## 2012-04-07 DIAGNOSIS — Z23 Encounter for immunization: Secondary | ICD-10-CM

## 2012-04-07 LAB — CBC WITH DIFFERENTIAL/PLATELET
Basophils Absolute: 0 10*3/uL (ref 0.0–0.1)
Eosinophils Absolute: 0.2 10*3/uL (ref 0.0–0.7)
HCT: 36 % (ref 36.0–46.0)
Hemoglobin: 12.3 g/dL (ref 12.0–15.0)
Lymphocytes Relative: 28.3 % (ref 12.0–46.0)
Lymphs Abs: 1.5 10*3/uL (ref 0.7–4.0)
MCHC: 34.1 g/dL (ref 30.0–36.0)
Monocytes Absolute: 0.4 10*3/uL (ref 0.1–1.0)
Neutro Abs: 3.1 10*3/uL (ref 1.4–7.7)
RDW: 12.3 % (ref 11.5–14.6)

## 2012-04-07 LAB — LIPID PANEL
HDL: 50.4 mg/dL (ref >39.00)
LDL Cholesterol: 90 mg/dL (ref 0–99)
Total CHOL/HDL Ratio: 3
Triglycerides: 53 mg/dL (ref 0.0–149.0)

## 2012-04-07 LAB — COMPREHENSIVE METABOLIC PANEL
ALT: 12 U/L (ref 0–35)
AST: 19 U/L (ref 0–37)
Alkaline Phosphatase: 63 U/L (ref 39–117)
Creatinine, Ser: 0.8 mg/dL (ref 0.4–1.2)
Total Bilirubin: 0.7 mg/dL (ref 0.3–1.2)

## 2012-04-07 NOTE — Assessment & Plan Note (Signed)
Reviewed health habits including diet and exercise and skin cancer prevention Also reviewed health mt list, fam hx and immunizations   Lab today Flu shot today Will fill out form for work  Enc regular exercise She will f/u with her gyn for annual pap- hx of abn paps in past

## 2012-04-07 NOTE — Patient Instructions (Addendum)
Flu vaccine today  Labs today  Try to work in some regular exercise  Don't forget your yearly gyn visit  We will call you to pick up form when it is ready

## 2012-04-07 NOTE — Progress Notes (Signed)
Subjective:    Patient ID: Rebecca Peters, female    DOB: [birth year], 36 y.o.   MRN: [id number]  HPI Here for health maintenance exam and to review chronic medical problems   Has a form for work to fill out Needs labs today  Nothing new health wise    Wt is up 10 lb with bmi of 26  Diet- is eating a healthy diet - very good about  Exercise- not a lot , is doing a lot of wall painting lately - has moved twice this year   Hx of cervical dysplasia Colp 12/11 Last pap - was about a year ago - she goes to [facility] , is time for her to make an appt  Last pap was normal    Flu vaccine - has not had yet   Goes to the dermatologist in summer - and goes at the end of year  Hx of skin ca in the past - basal cell  Nothing new in July  Patient Active Problem List  Diagnosis  . DYSPLASIA OF CERVIX UNSPECIFIED  . COCCYGEAL PAIN  . SKIN RASH  . SKIN CANCER, HX OF  . Orthostatic headache  . Screening for lipoid disorders  . Diabetes mellitus screening  . Routine general medical examination at a health care facility   Past Medical History  Diagnosis Date  . Late menses     long cycle---menses every 6 weeks  . Personal history of other malignant neoplasm of skin   . Dysplasia of cervix, unspecified    Past Surgical History  Procedure Date  . Appendectomy 5/10  . Melanoma excision 10/11    lymphnodes stage 2; wide excision with 2 LN removed  . Colposcopy 12/11   History  Substance Use Topics  . Smoking status: Never Smoker   . Smokeless tobacco: Never Used  . Alcohol Use: Yes     Comment: 1 drink every few months   Family History  Problem Relation Age of Onset  . Hyperlipidemia Mother   . Hyperlipidemia Father   . Hypertension Father   . Cancer Neg Hx    Allergies  Allergen Reactions  . Metoclopramide Hcl     REACTION: trouble breathing and muscle spasms to face   No current outpatient prescriptions on file prior to visit.           Review of  Systems Review of Systems  Constitutional: Negative for fever, appetite change, fatigue and unexpected weight change.  Eyes: Negative for pain and visual disturbance.  Respiratory: Negative for cough and shortness of breath.   Cardiovascular: Negative for cp or palpitations    Gastrointestinal: Negative for nausea, diarrhea and constipation.  Genitourinary: Negative for urgency and frequency.  Skin: Negative for pallor or rash   Neurological: Negative for weakness, light-headedness, numbness and headaches.  Hematological: Negative for adenopathy. Does not bruise/bleed easily.  Psychiatric/Behavioral: Negative for dysphoric mood. The patient is not nervous/anxious.         Objective:   Physical Exam  Constitutional: She is oriented to person, place, and time. She appears well-developed and well-nourished. No distress.  HENT:  Head: Normocephalic and atraumatic.  Right Ear: External ear normal.  Left Ear: External ear normal.  Nose: Nose normal.  Mouth/Throat: Oropharynx is clear and moist.  Eyes: Conjunctivae normal and EOM are normal. Pupils are equal, round, and reactive to light. Right eye exhibits no discharge. Left eye exhibits no discharge. No scleral icterus.  Neck: Normal range of motion.  Neck supple. No JVD present. Carotid bruit is not present. No thyromegaly present.  Cardiovascular: Normal rate, regular rhythm, normal heart sounds and intact distal pulses.  Exam reveals no gallop.   Pulmonary/Chest: Effort normal and breath sounds normal. No respiratory distress. She has no wheezes.  Abdominal: Soft. Bowel sounds are normal. She exhibits no distension, no abdominal bruit and no mass. There is no tenderness.  Musculoskeletal: Normal range of motion. She exhibits no edema and no tenderness.  Lymphadenopathy:    She has no cervical adenopathy.  Neurological: She is alert and oriented to person, place, and time. She has normal reflexes. No cranial nerve deficit. She exhibits  normal muscle tone. Coordination normal.  Skin: Skin is warm and dry. No rash noted. No erythema. No pallor.  Psychiatric: She has a normal mood and affect.          Assessment & Plan:

## 2012-04-10 ENCOUNTER — Telehealth: Payer: Self-pay

## 2012-04-10 NOTE — Telephone Encounter (Signed)
Pt requested copy of 03/2012 lab results mailed to pts verified home address. Notified pt done.

## 2013-03-10 ENCOUNTER — Telehealth: Payer: Self-pay | Admitting: Family Medicine

## 2013-03-10 DIAGNOSIS — Z0000 Encounter for general adult medical examination without abnormal findings: Secondary | ICD-10-CM

## 2013-03-10 NOTE — Telephone Encounter (Signed)
Message copied by Judy Pimple on Wed Mar 10, 2013  7:57 AM ------      Message from: Alvina Chou      Created: Fri Feb 26, 2013  4:15 PM      Regarding: Lab orders for Thursday, 10.23.14       Patient is scheduled for CPX labs, please order future labs, Thanks , Terri       ------

## 2013-03-11 ENCOUNTER — Other Ambulatory Visit (INDEPENDENT_AMBULATORY_CARE_PROVIDER_SITE_OTHER): Payer: BC Managed Care – PPO

## 2013-03-11 DIAGNOSIS — Z0000 Encounter for general adult medical examination without abnormal findings: Secondary | ICD-10-CM

## 2013-03-11 DIAGNOSIS — Z131 Encounter for screening for diabetes mellitus: Secondary | ICD-10-CM

## 2013-03-11 DIAGNOSIS — Z13220 Encounter for screening for lipoid disorders: Secondary | ICD-10-CM

## 2013-03-11 LAB — COMPREHENSIVE METABOLIC PANEL
ALT: 11 U/L (ref 0–35)
AST: 17 U/L (ref 0–37)
Albumin: 4 g/dL (ref 3.5–5.2)
Alkaline Phosphatase: 61 U/L (ref 39–117)
CO2: 27 mEq/L (ref 19–32)
Chloride: 102 mEq/L (ref 96–112)
GFR: 80.28 mL/min (ref >60.00)
Sodium: 137 mEq/L (ref 135–145)
Total Bilirubin: 0.4 mg/dL (ref 0.3–1.2)
Total Protein: 7.5 g/dL (ref 6.0–8.3)

## 2013-03-11 LAB — CBC WITH DIFFERENTIAL/PLATELET
Basophils Absolute: 0.1 10*3/uL (ref 0.0–0.1)
HCT: 38.6 % (ref 36.0–46.0)
Lymphocytes Relative: 28 % (ref 12.0–46.0)
Lymphs Abs: 1.8 10*3/uL (ref 0.7–4.0)
MCV: 89.1 fl (ref 78.0–100.0)
Monocytes Relative: 9.5 % (ref 3.0–12.0)
Platelets: 218 10*3/uL (ref 150.0–400.0)
RDW: 12.7 % (ref 11.5–14.6)

## 2013-03-11 LAB — LIPID PANEL
LDL Cholesterol: 85 mg/dL (ref 0–99)
Total CHOL/HDL Ratio: 3
VLDL: 15.6 mg/dL (ref 0.0–40.0)

## 2013-03-11 LAB — TSH: TSH: 2.2 u[IU]/mL (ref 0.35–5.50)

## 2013-03-17 ENCOUNTER — Encounter: Payer: Self-pay | Admitting: Family Medicine

## 2013-03-17 ENCOUNTER — Ambulatory Visit (INDEPENDENT_AMBULATORY_CARE_PROVIDER_SITE_OTHER): Payer: BC Managed Care – PPO | Admitting: Family Medicine

## 2013-03-17 VITALS — BP 86/58 | HR 63 | Temp 98.2°F | Ht 61.65 in | Wt 135.2 lb

## 2013-03-17 DIAGNOSIS — Z0000 Encounter for general adult medical examination without abnormal findings: Secondary | ICD-10-CM

## 2013-03-17 DIAGNOSIS — Z23 Encounter for immunization: Secondary | ICD-10-CM

## 2013-03-17 NOTE — Progress Notes (Signed)
Subjective:    Patient ID: Rebecca Peters, female    DOB: [birth year], 37 y.o.   MRN: [id number]  HPI Here for health maintenance exam and to review chronic medical problems  Has  Paperwork for health insurance discount   Has been doing very well   Wt is down 10 lb with bmi of 25 Got motivated to loose wt this summer  Is an avid biker and also in a yoga class (intense workout)  By her scale over 11-12 lb  She also gave up sweet tea - she drank lots of it prev all day Avoiding caffeine also Drinks water and some milk   Overall she is feeling very good   Pap had end of feb Now her paps are normal  [facility]    Flu vaccine - just gave it to her   Td 12/11  Labs    Chemistry      Component Value Date/Time   NA 137 03/11/2013 0834   K 3.8 03/11/2013 0834   CL 102 03/11/2013 0834   CO2 27 03/11/2013 0834   BUN 15 03/11/2013 0834   CREATININE 0.8 03/11/2013 0834      Component Value Date/Time   CALCIUM 9.1 03/11/2013 0834   ALKPHOS 61 03/11/2013 0834   AST 17 03/11/2013 0834   ALT 11 03/11/2013 0834   BILITOT 0.4 03/11/2013 0834     Lab Results  Component Value Date   WBC 6.3 03/11/2013   HGB 13.3 03/11/2013   HCT 38.6 03/11/2013   MCV 89.1 03/11/2013   PLT 218.0 03/11/2013    Lab Results  Component Value Date   TSH 2.20 03/11/2013   Lab Results  Component Value Date   CHOL 150 03/11/2013   CHOL 151 04/07/2012   CHOL 145 03/22/2011   Lab Results  Component Value Date   HDL 49.20 03/11/2013   HDL 50.40 04/07/2012   HDL 49 03/22/2011   Lab Results  Component Value Date   LDLCALC 85 03/11/2013   LDLCALC 90 04/07/2012   LDLCALC 69 03/22/2011   Lab Results  Component Value Date   TRIG 78.0 03/11/2013   TRIG 53.0 04/07/2012   TRIG 136 03/22/2011   Lab Results  Component Value Date   CHOLHDL 3 03/11/2013   CHOLHDL 3 04/07/2012   CHOLHDL 3.0 03/22/2011   No results found for this basename: LDLDIRECT     Very good labs   She had one  baseline mammogram this year  Review of Systems Review of Systems  Constitutional: Negative for fever, appetite change, fatigue and unexpected weight change.  Eyes: Negative for pain and visual disturbance.  Respiratory: Negative for cough and shortness of breath.   Cardiovascular: Negative for cp or palpitations    Gastrointestinal: Negative for nausea, diarrhea and constipation.  Genitourinary: Negative for urgency and frequency.  Skin: Negative for pallor or rash   Neurological: Negative for weakness, light-headedness, numbness and headaches.  Hematological: Negative for adenopathy. Does not bruise/bleed easily.  Psychiatric/Behavioral: Negative for dysphoric mood. The patient is not nervous/anxious.         Objective:   Physical Exam  Constitutional: She appears well-developed and well-nourished. No distress.  HENT:  Head: Normocephalic and atraumatic.  Right Ear: External ear normal.  Left Ear: External ear normal.  Nose: Nose normal.  Mouth/Throat: Oropharynx is clear and moist.  Eyes: Conjunctivae and EOM are normal. Pupils are equal, round, and reactive to light. Right eye exhibits no discharge. Left eye exhibits no  discharge. No scleral icterus.  Neck: Normal range of motion. Neck supple. No JVD present. No thyromegaly present.  Cardiovascular: Normal rate, regular rhythm, normal heart sounds and intact distal pulses.  Exam reveals no gallop.   Pulmonary/Chest: Effort normal and breath sounds normal. No respiratory distress. She has no wheezes. She has no rales.  Abdominal: Soft. Bowel sounds are normal. She exhibits no distension and no mass. There is no tenderness.  Musculoskeletal: She exhibits no edema and no tenderness.  Lymphadenopathy:    She has no cervical adenopathy.  Neurological: She is alert. She has normal reflexes. No cranial nerve deficit. She exhibits normal muscle tone. Coordination normal.  Skin: Skin is warm and dry. No rash noted. No erythema. No  pallor.  Dermatofibroma R leg  Psychiatric: She has a normal mood and affect.          Assessment & Plan:

## 2013-03-17 NOTE — Patient Instructions (Signed)
You are doing great  Keep up the good work with healthy diet and exercise  Flu shot done today  Try to get 1200-1500 mg of calcium per day with at least 1000 iu of vitamin D - for bone health

## 2013-03-18 NOTE — Assessment & Plan Note (Signed)
Reviewed health habits including diet and exercise and skin cancer prevention Also reviewed health mt list, fam hx and immunizations  utd gyn care  Flu shot today Labs rev

## 2014-02-24 ENCOUNTER — Telehealth: Payer: Self-pay | Admitting: Family Medicine

## 2014-02-24 NOTE — Telephone Encounter (Signed)
Patient had her last cpx on 03/17/13.  Patient needs a form filled out for work by December.  You don't have any physical slots open before the end of the year.  Can patient be seen sooner for her physical?

## 2014-02-24 NOTE — Telephone Encounter (Signed)
Please put her in a 30 min slot-thanks

## 2014-03-20 LAB — HM PAP SMEAR

## 2014-03-23 ENCOUNTER — Telehealth: Payer: Self-pay | Admitting: Family Medicine

## 2014-03-23 DIAGNOSIS — Z0000 Encounter for general adult medical examination without abnormal findings: Secondary | ICD-10-CM

## 2014-03-23 NOTE — Telephone Encounter (Signed)
-----   Message from Ellamae Sia sent at 03/16/2014  5:27 PM EDT ----- Regarding: Lab orders for Thursday, 11.5.15 Patient is scheduled for CPX labs, please order future labs, Thanks , Karna Christmas

## 2014-03-25 ENCOUNTER — Other Ambulatory Visit: Payer: BC Managed Care – PPO

## 2014-03-28 ENCOUNTER — Other Ambulatory Visit (INDEPENDENT_AMBULATORY_CARE_PROVIDER_SITE_OTHER): Payer: BC Managed Care – PPO

## 2014-03-28 DIAGNOSIS — Z0000 Encounter for general adult medical examination without abnormal findings: Secondary | ICD-10-CM

## 2014-03-28 LAB — COMPREHENSIVE METABOLIC PANEL
ALBUMIN: 3.6 g/dL (ref 3.5–5.2)
ALK PHOS: 63 U/L (ref 39–117)
ALT: 13 U/L (ref 0–35)
AST: 20 U/L (ref 0–37)
BUN: 14 mg/dL (ref 6–23)
CO2: 25 mEq/L (ref 19–32)
CREATININE: 0.8 mg/dL (ref 0.4–1.2)
Calcium: 9.1 mg/dL (ref 8.4–10.5)
Chloride: 102 mEq/L (ref 96–112)
GFR: 86.98 mL/min (ref >60.00)
Glucose, Bld: 89 mg/dL (ref 70–99)
Potassium: 3.7 mEq/L (ref 3.5–5.1)
Sodium: 138 mEq/L (ref 135–145)
Total Bilirubin: 0.4 mg/dL (ref 0.2–1.2)
Total Protein: 7.5 g/dL (ref 6.0–8.3)

## 2014-03-28 LAB — CBC WITH DIFFERENTIAL/PLATELET
BASOS PCT: 0.7 % (ref 0.0–3.0)
Basophils Absolute: 0 10*3/uL (ref 0.0–0.1)
Eosinophils Absolute: 0.4 10*3/uL (ref 0.0–0.7)
Eosinophils Relative: 5.3 % — ABNORMAL HIGH (ref 0.0–5.0)
HEMATOCRIT: 39.3 % (ref 36.0–46.0)
HEMOGLOBIN: 13.2 g/dL (ref 12.0–15.0)
LYMPHS PCT: 35 % (ref 12.0–46.0)
Lymphs Abs: 2.4 10*3/uL (ref 0.7–4.0)
MCHC: 33.5 g/dL (ref 30.0–36.0)
MCV: 91.2 fl (ref 78.0–100.0)
MONO ABS: 0.5 10*3/uL (ref 0.1–1.0)
Monocytes Relative: 8 % (ref 3.0–12.0)
Neutro Abs: 3.4 10*3/uL (ref 1.4–7.7)
Neutrophils Relative %: 51 % (ref 43.0–77.0)
Platelets: 239 10*3/uL (ref 150.0–400.0)
RBC: 4.31 Mil/uL (ref 3.87–5.11)
RDW: 12.7 % (ref 11.5–15.5)
WBC: 6.7 10*3/uL (ref 4.0–10.5)

## 2014-03-28 LAB — LIPID PANEL
CHOL/HDL RATIO: 3
Cholesterol: 167 mg/dL (ref 0–200)
HDL: 54.9 mg/dL (ref >39.00)
LDL CALC: 98 mg/dL (ref 0–99)
NonHDL: 112.1
TRIGLYCERIDES: 71 mg/dL (ref 0.0–149.0)
VLDL: 14.2 mg/dL (ref 0.0–40.0)

## 2014-03-28 LAB — TSH: TSH: 1.37 u[IU]/mL (ref 0.35–4.50)

## 2014-03-30 ENCOUNTER — Encounter: Payer: Self-pay | Admitting: Family Medicine

## 2014-03-30 ENCOUNTER — Ambulatory Visit (INDEPENDENT_AMBULATORY_CARE_PROVIDER_SITE_OTHER): Payer: BC Managed Care – PPO | Admitting: Family Medicine

## 2014-03-30 VITALS — BP 98/62 | HR 54 | Temp 98.3°F | Ht 61.75 in | Wt 132.0 lb

## 2014-03-30 DIAGNOSIS — Z0000 Encounter for general adult medical examination without abnormal findings: Secondary | ICD-10-CM

## 2014-03-30 DIAGNOSIS — Z23 Encounter for immunization: Secondary | ICD-10-CM

## 2014-03-30 NOTE — Assessment & Plan Note (Signed)
Reviewed health habits including diet and exercise and skin cancer prevention Reviewed appropriate screening tests for age  Also reviewed health mt list, fam hx and immunization status , as well as social and family history   Flu vaccine today  Gyn utd in the spring Will start getting annual mammograms in the spring and says she has a baseline last year  Labs reviewed Work form filled out

## 2014-03-30 NOTE — Progress Notes (Signed)
Pre visit review using our clinic review tool, if applicable. No additional management support is needed unless otherwise documented below in the visit note. 

## 2014-03-30 NOTE — Patient Instructions (Signed)
Flu vaccine  Keep up the good exercise  Take care of yourself

## 2014-03-30 NOTE — Progress Notes (Signed)
Subjective:    Patient ID: Rebecca Peters, female    DOB: August 13, 1975, 38 y.o.   MRN: [id number]  HPI Here for health maintenance exam and to review chronic medical problems    Nothing new going on  Working with vacation in the summer - rented an RV and went to [place] addicted to hiking Also loves to mt bike   Rebecca Peters is down 3 lb with bmi of 24  Flu vaccine - will get that today   Pap/gyn care- spring around April / pap smear was fine  Will turn 38 in December - and she has already had a mammogram last year ( baseline)  Self breast exam - no lumps   Td 12/11  Results for orders placed or performed in visit on 03/28/14  CBC with Differential  Result Value Ref Range   WBC 6.7 4.0 - 10.5 K/uL   RBC 4.31 3.87 - 5.11 Mil/uL   Hemoglobin 13.2 12.0 - 15.0 g/dL   HCT 39.3 36.0 - 46.0 %   MCV 91.2 78.0 - 100.0 fl   MCHC 33.5 30.0 - 36.0 g/dL   RDW 12.7 11.5 - 15.5 %   Platelets 239.0 150.0 - 400.0 K/uL   Neutrophils Relative % 51.0 43.0 - 77.0 %   Lymphocytes Relative 35.0 12.0 - 46.0 %   Monocytes Relative 8.0 3.0 - 12.0 %   Eosinophils Relative 5.3 (H) 0.0 - 5.0 %   Basophils Relative 0.7 0.0 - 3.0 %   Neutro Abs 3.4 1.4 - 7.7 K/uL   Lymphs Abs 2.4 0.7 - 4.0 K/uL   Monocytes Absolute 0.5 0.1 - 1.0 K/uL   Eosinophils Absolute 0.4 0.0 - 0.7 K/uL   Basophils Absolute 0.0 0.0 - 0.1 K/uL  Comprehensive metabolic panel  Result Value Ref Range   Sodium 138 135 - 145 mEq/L   Potassium 3.7 3.5 - 5.1 mEq/L   Chloride 102 96 - 112 mEq/L   CO2 25 19 - 32 mEq/L   Glucose, Bld 89 70 - 99 mg/dL   BUN 14 6 - 23 mg/dL   Creatinine, Ser 0.8 0.4 - 1.2 mg/dL   Total Bilirubin 0.4 0.2 - 1.2 mg/dL   Alkaline Phosphatase 63 39 - 117 U/L   AST 20 0 - 37 U/L   ALT 13 0 - 35 U/L   Total Protein 7.5 6.0 - 8.3 g/dL   Albumin 3.6 3.5 - 5.2 g/dL   Calcium 9.1 8.4 - 10.5 mg/dL   GFR 86.98 >60.00 mL/min  Lipid panel  Result Value Ref Range   Cholesterol 167 0 - 200 mg/dL   Triglycerides 71.0 0.0 - 149.0 mg/dL   HDL 54.90 >39.00 mg/dL   VLDL 14.2 0.0 - 40.0 mg/dL   LDL Cholesterol 98 0 - 99 mg/dL   Total CHOL/HDL Ratio 3    NonHDL 112.10   TSH  Result Value Ref Range   TSH 1.37 0.35 - 4.50 uIU/mL    Overall very good labs  Great cholesterol   Patient Active Problem List   Diagnosis Date Noted  . Routine general medical examination at a health care facility 03/24/2011  . Screening for lipoid disorders 03/22/2011  . Diabetes mellitus screening 03/22/2011  . DYSPLASIA OF CERVIX UNSPECIFIED 05/17/2010  . COCCYGEAL PAIN 05/15/2010  . SKIN RASH 05/15/2010  . SKIN CANCER, HX OF 05/15/2010   Past Medical History  Diagnosis Date  . Late menses     long cycle---menses every 6 weeks  .  Personal history of other malignant neoplasm of skin   . Dysplasia of cervix, unspecified    Past Surgical History  Procedure Laterality Date  . Appendectomy  5/10  . Melanoma excision  10/11    lymphnodes stage 2; wide excision with 2 LN removed  . Colposcopy  12/11   History  Substance Use Topics  . Smoking status: Never Smoker   . Smokeless tobacco: Never Used  . Alcohol Use: 0.0 oz/week    0 Not specified per week     Comment: 1 drink every few months   Family History  Problem Relation Age of Onset  . Hyperlipidemia Mother   . Hyperlipidemia Father   . Hypertension Father   . Cancer Neg Hx    Allergies  Allergen Reactions  . Metoclopramide Hcl     REACTION: trouble breathing and muscle spasms to face   No current outpatient prescriptions on file prior to visit.   No current facility-administered medications on file prior to visit.     Review of Systems Review of Systems  Constitutional: Negative for fever, appetite change, fatigue and unexpected weight change.  Eyes: Negative for pain and visual disturbance.  Respiratory: Negative for cough and shortness of breath.   Cardiovascular: Negative for cp or palpitations    Gastrointestinal: Negative  for nausea, diarrhea and constipation.  Genitourinary: Negative for urgency and frequency.  Skin: Negative for pallor or rash   Neurological: Negative for weakness, light-headedness, numbness and headaches.  Hematological: Negative for adenopathy. Does not bruise/bleed easily.  Psychiatric/Behavioral: Negative for dysphoric mood. The patient is not nervous/anxious.         Objective:   Physical Exam  Constitutional: She appears well-developed and well-nourished. No distress.  HENT:  Head: Normocephalic and atraumatic.  Right Ear: External ear normal.  Left Ear: External ear normal.  Nose: Nose normal.  Mouth/Throat: Oropharynx is clear and moist.  Eyes: Conjunctivae and EOM are normal. Pupils are equal, round, and reactive to light. Right eye exhibits no discharge. Left eye exhibits no discharge. No scleral icterus.  Neck: Normal range of motion. Neck supple. No JVD present. No thyromegaly present.  Cardiovascular: Normal rate, regular rhythm, normal heart sounds and intact distal pulses.  Exam reveals no gallop.   Pulmonary/Chest: Effort normal and breath sounds normal. No respiratory distress. She has no wheezes. She has no rales.  Abdominal: Soft. Bowel sounds are normal. She exhibits no distension and no mass. There is no tenderness.  Musculoskeletal: She exhibits no edema or tenderness.  Lymphadenopathy:    She has no cervical adenopathy.  Neurological: She is alert. She has normal reflexes. No cranial nerve deficit. She exhibits normal muscle tone. Coordination normal.  Skin: Skin is warm and dry. No rash noted. No erythema. No pallor.  Psychiatric: She has a normal mood and affect.          Assessment & Plan:   Problem List Items Addressed This Visit      Other   Routine general medical examination at a health care facility - Primary    Reviewed health habits including diet and exercise and skin cancer prevention Reviewed appropriate screening tests for age  Also  reviewed health mt list, fam hx and immunization status , as well as social and family history   Flu vaccine today  Gyn utd in the spring Will start getting annual mammograms in the spring and says she has a baseline last year  Labs reviewed Work form filled out  Other Visit Diagnoses    Need for prophylactic vaccination and inoculation against influenza        Relevant Orders       Flu Vaccine QUAD 36+ mos IM (Completed)

## 2014-05-21 LAB — HM MAMMOGRAPHY

## 2014-06-20 ENCOUNTER — Encounter: Payer: Self-pay | Admitting: Family Medicine

## 2014-06-20 ENCOUNTER — Ambulatory Visit (INDEPENDENT_AMBULATORY_CARE_PROVIDER_SITE_OTHER): Payer: BLUE CROSS/BLUE SHIELD | Admitting: Family Medicine

## 2014-06-20 VITALS — BP 118/76 | HR 74 | Temp 98.4°F | Ht 62.0 in | Wt 134.8 lb

## 2014-06-20 DIAGNOSIS — R1900 Intra-abdominal and pelvic swelling, mass and lump, unspecified site: Secondary | ICD-10-CM | POA: Insufficient documentation

## 2014-06-20 DIAGNOSIS — N6011 Diffuse cystic mastopathy of right breast: Secondary | ICD-10-CM

## 2014-06-20 DIAGNOSIS — N6012 Diffuse cystic mastopathy of left breast: Secondary | ICD-10-CM

## 2014-06-20 NOTE — Progress Notes (Signed)
Pre visit review using our clinic review tool, if applicable. No additional management support is needed unless otherwise documented below in the visit note. 

## 2014-06-20 NOTE — Assessment & Plan Note (Signed)
Small lump palpated deep in fat tissue just to the L of umbilicus  It is mobile and rubbery  Disc poss of of lipoma/ca deposit or less likely LN (no adenopathy on exam) Will watch for 2 wk - urged pt not to over palpate it and cause bruising  At that time if still present would consider imaging  Pt will update if it enlarges or other symptoms develop

## 2014-06-20 NOTE — Patient Instructions (Signed)
Keep an eye on the lump in your abdomen  If not smaller or gone in 2 weeks -let me know and we can consider imaging  Keep doing self breast exams

## 2014-06-20 NOTE — Assessment & Plan Note (Signed)
Pending recent mammogram  Ropey tissue noted  occ breast pain  Disc avoidance of caffeine  She will have gyn f/u in June as well

## 2014-06-20 NOTE — Progress Notes (Signed)
Subjective:    Patient ID: Rebecca Peters, female    DOB: Jan 12, 1976, 39 y.o.   MRN: [id number]  HPI Here with a knot on her abdomen  Noticed it at the end of December  Thought she felt a knot on her abdomen  Still there -no changes , not tender (she pressed on it enough to bruise it however)  Had a mammogram last week  She felt a knot in L breast - pending her result   Her annual visit with gyn in June   Her dermatology visit was in Oct - removed a few areas - dysplastic but not ca (more surgery was not needed) Dr Nicole Kindred   Has been feeling fine   Patient Active Problem List   Diagnosis Date Noted  . Routine general medical examination at a health care facility 03/24/2011  . Screening for lipoid disorders 03/22/2011  . Diabetes mellitus screening 03/22/2011  . DYSPLASIA OF CERVIX UNSPECIFIED 05/17/2010  . COCCYGEAL PAIN 05/15/2010  . SKIN RASH 05/15/2010  . SKIN CANCER, HX OF 05/15/2010   Past Medical History  Diagnosis Date  . Late menses     long cycle---menses every 6 weeks  . Personal history of other malignant neoplasm of skin   . Dysplasia of cervix, unspecified    Past Surgical History  Procedure Laterality Date  . Appendectomy  5/10  . Melanoma excision  10/11    lymphnodes stage 2; wide excision with 2 LN removed  . Colposcopy  12/11   History  Substance Use Topics  . Smoking status: Never Smoker   . Smokeless tobacco: Never Used  . Alcohol Use: 0.0 oz/week    0 Not specified per week     Comment: 1 drink every few months   Family History  Problem Relation Age of Onset  . Hyperlipidemia Mother   . Hyperlipidemia Father   . Hypertension Father   . Cancer Neg Hx    Allergies  Allergen Reactions  . Metoclopramide Hcl     REACTION: trouble breathing and muscle spasms to face   No current outpatient prescriptions on file prior to visit.   No current facility-administered medications on file prior to visit.      Review of  Systems Review of Systems  Constitutional: Negative for fever, appetite change, fatigue and unexpected weight change.  Eyes: Negative for pain and visual disturbance.  Respiratory: Negative for cough and shortness of breath.   Cardiovascular: Negative for cp or palpitations    Gastrointestinal: Negative for nausea, diarrhea and constipation.  Genitourinary: Negative for urgency and frequency.  Skin: Negative for pallor or rash   Neurological: Negative for weakness, light-headedness, numbness and headaches.  Hematological: Negative for adenopathy. Does not bruise/bleed easily.  Psychiatric/Behavioral: Negative for dysphoric mood. The patient is not nervous/anxious.         Objective:   Physical Exam  Constitutional: She appears well-developed and well-nourished. No distress.  HENT:  Head: Normocephalic and atraumatic.  Mouth/Throat: Oropharynx is clear and moist.  Eyes: Conjunctivae and EOM are normal. Pupils are equal, round, and reactive to light. No scleral icterus.  Neck: Normal range of motion. Neck supple.  Cardiovascular: Normal rate, regular rhythm and normal heart sounds.   Pulmonary/Chest: Effort normal and breath sounds normal. No respiratory distress. She has no wheezes. She has no rales. She exhibits no tenderness.  Abdominal: Soft. Bowel sounds are normal. She exhibits no distension. There is no tenderness. There is no rebound and no guarding.  Small palpable rubbery density palpated deep in the fatty tissue of abd to L of umbilicus  It seems to be mobile  Non tender No skin changes   Genitourinary:  Breast exam: No  nodules, thickening, tenderness, bulging, retraction, inflamation, nipple discharge or skin changes noted.  No axillary or clavicular LA.     Bilateral ropey texture consistent with dense/fibrocystic breasts  Small mobile pea sized lump in L breast in upper outer quadrant is non tender   Musculoskeletal: She exhibits no edema.  Lymphadenopathy:    She has  no cervical adenopathy.  Neurological: She is alert. She has normal reflexes. No cranial nerve deficit. She exhibits normal muscle tone. Coordination normal.  Skin: Skin is warm and dry. No rash noted. No erythema.  Psychiatric: She has a normal mood and affect.          Assessment & Plan:   Problem List Items Addressed This Visit      Other   Fibrocystic disease of both breasts - Primary    Pending recent mammogram  Ropey tissue noted  occ breast pain  Disc avoidance of caffeine  She will have gyn f/u in June as well       Lump in the abdomen    Small lump palpated deep in fat tissue just to the L of umbilicus  It is mobile and rubbery  Disc poss of of lipoma/ca deposit or less likely LN (no adenopathy on exam) Will watch for 2 wk - urged pt not to over palpate it and cause bruising  At that time if still present would consider imaging  Pt will update if it enlarges or other symptoms develop

## 2014-07-18 ENCOUNTER — Encounter: Payer: Self-pay | Admitting: Family Medicine

## 2014-07-20 ENCOUNTER — Telehealth: Payer: Self-pay | Admitting: Family Medicine

## 2014-07-20 DIAGNOSIS — R1900 Intra-abdominal and pelvic swelling, mass and lump, unspecified site: Secondary | ICD-10-CM

## 2014-07-20 NOTE — Telephone Encounter (Signed)
Referral for ultrasound for pt - has a knot felt in abdomen

## 2014-07-28 ENCOUNTER — Ambulatory Visit
Admission: RE | Admit: 2014-07-28 | Discharge: 2014-07-28 | Disposition: A | Discharge status: Home / Self Care | Payer: BLUE CROSS/BLUE SHIELD | Source: Ambulatory Visit | Attending: Family Medicine | Admitting: Family Medicine

## 2014-07-28 ENCOUNTER — Other Ambulatory Visit: Payer: Self-pay | Admitting: Family Medicine

## 2014-07-28 ENCOUNTER — Telehealth: Payer: Self-pay

## 2014-07-28 DIAGNOSIS — R1900 Intra-abdominal and pelvic swelling, mass and lump, unspecified site: Secondary | ICD-10-CM

## 2014-07-28 NOTE — Telephone Encounter (Signed)
Yes that is fine  thanks

## 2014-07-28 NOTE — Telephone Encounter (Signed)
Kela with [name] at [facility] said pt is there for abd complete US; Kela said that will evaluate internal organs such as kidney, liver, spleen, etc. And a limited abd [name]  Would ck the lump in abd lt of umbilicus. Rosine Door can change order in epic with authorization from doctor. Dr Danise Mina said OK to change to limited abd [name] and forward note to Dr Glori Bickers. Kela advised and will change order to limited abd [name].

## 2014-09-19 ENCOUNTER — Encounter: Payer: Self-pay | Admitting: Family Medicine

## 2014-10-02 ENCOUNTER — Telehealth: Payer: Self-pay | Admitting: Family Medicine

## 2014-10-02 DIAGNOSIS — R1900 Intra-abdominal and pelvic swelling, mass and lump, unspecified site: Secondary | ICD-10-CM

## 2014-10-02 NOTE — Telephone Encounter (Signed)
Refer for Ct of abdomen without contrast to better visualize ant abdominal wall hernia seen on [name]

## 2014-10-06 ENCOUNTER — Inpatient Hospital Stay: Admission: RE | Admit: 2014-10-06 | Payer: BLUE CROSS/BLUE SHIELD | Source: Ambulatory Visit

## 2014-11-02 ENCOUNTER — Ambulatory Visit (INDEPENDENT_AMBULATORY_CARE_PROVIDER_SITE_OTHER)
Admission: RE | Admit: 2014-11-02 | Discharge: 2014-11-02 | Disposition: A | Discharge status: Home / Self Care | Payer: BLUE CROSS/BLUE SHIELD | Source: Ambulatory Visit | Attending: Family Medicine | Admitting: Family Medicine

## 2014-11-02 DIAGNOSIS — R1900 Intra-abdominal and pelvic swelling, mass and lump, unspecified site: Secondary | ICD-10-CM

## 2014-11-03 ENCOUNTER — Encounter: Payer: Self-pay | Admitting: Family Medicine

## 2015-02-01 ENCOUNTER — Telehealth: Payer: Self-pay | Admitting: Family Medicine

## 2015-02-01 NOTE — Telephone Encounter (Signed)
Patient called to schedule her physical.  She needs lab work done for work. Patient needs it done by December 6,2016.  Her last physical was November of last year.  Dr.Tower's first available for a physical is April,2017.  Can patient be seen before December for her physical?

## 2015-02-01 NOTE — Telephone Encounter (Signed)
Please put her in a 30 min slot Thanks   

## 2015-03-27 ENCOUNTER — Telehealth: Payer: Self-pay | Admitting: Family Medicine

## 2015-03-27 DIAGNOSIS — Z0000 Encounter for general adult medical examination without abnormal findings: Secondary | ICD-10-CM

## 2015-03-27 NOTE — Telephone Encounter (Signed)
-----   Message from Marchia Bond sent at 03/23/2015  1:01 PM EDT ----- Regarding: Cpx labs Thurs 11/10 need orders, thanks, :-) Please order  future cpx labs for pt's upcoming lab appt. Thanks Aniceto Boss

## 2015-03-30 ENCOUNTER — Other Ambulatory Visit (INDEPENDENT_AMBULATORY_CARE_PROVIDER_SITE_OTHER): Payer: BLUE CROSS/BLUE SHIELD

## 2015-03-30 ENCOUNTER — Other Ambulatory Visit: Payer: BLUE CROSS/BLUE SHIELD

## 2015-03-30 DIAGNOSIS — Z0000 Encounter for general adult medical examination without abnormal findings: Secondary | ICD-10-CM | POA: Diagnosis not present

## 2015-03-30 LAB — LIPID PANEL
Cholesterol: 165 mg/dL (ref 0–200)
HDL: 51 mg/dL (ref >39.00)
LDL Cholesterol: 96 mg/dL (ref 0–99)
NonHDL: 113.7
Total CHOL/HDL Ratio: 3
Triglycerides: 91 mg/dL (ref 0.0–149.0)
VLDL: 18.2 mg/dL (ref 0.0–40.0)

## 2015-03-30 LAB — COMPREHENSIVE METABOLIC PANEL
ALT: 15 U/L (ref 0–35)
AST: 19 U/L (ref 0–37)
Albumin: 4.3 g/dL (ref 3.5–5.2)
Alkaline Phosphatase: 61 U/L (ref 39–117)
BUN: 12 mg/dL (ref 6–23)
CHLORIDE: 103 meq/L (ref 96–112)
CO2: 27 meq/L (ref 19–32)
Calcium: 9.4 mg/dL (ref 8.4–10.5)
Creatinine, Ser: 0.72 mg/dL (ref 0.40–1.20)
GFR: 94.92 mL/min (ref >60.00)
Glucose, Bld: 89 mg/dL (ref 70–99)
Potassium: 3.7 mEq/L (ref 3.5–5.1)
Sodium: 137 mEq/L (ref 135–145)
Total Bilirubin: 0.4 mg/dL (ref 0.2–1.2)
Total Protein: 7.3 g/dL (ref 6.0–8.3)

## 2015-03-30 LAB — CBC WITH DIFFERENTIAL/PLATELET
BASOS PCT: 1 % (ref 0.0–3.0)
Basophils Absolute: 0.1 10*3/uL (ref 0.0–0.1)
Eosinophils Absolute: 0.3 10*3/uL (ref 0.0–0.7)
Eosinophils Relative: 4.4 % (ref 0.0–5.0)
HEMATOCRIT: 38.9 % (ref 36.0–46.0)
Hemoglobin: 12.9 g/dL (ref 12.0–15.0)
LYMPHS ABS: 2 10*3/uL (ref 0.7–4.0)
LYMPHS PCT: 27.1 % (ref 12.0–46.0)
MCHC: 33.2 g/dL (ref 30.0–36.0)
MCV: 91.6 fl (ref 78.0–100.0)
MONOS PCT: 7.7 % (ref 3.0–12.0)
Monocytes Absolute: 0.6 10*3/uL (ref 0.1–1.0)
NEUTROS ABS: 4.3 10*3/uL (ref 1.4–7.7)
Neutrophils Relative %: 59.8 % (ref 43.0–77.0)
PLATELETS: 237 10*3/uL (ref 150.0–400.0)
RBC: 4.25 Mil/uL (ref 3.87–5.11)
RDW: 12.9 % (ref 11.5–15.5)
WBC: 7.2 10*3/uL (ref 4.0–10.5)

## 2015-03-30 LAB — TSH: TSH: 0.79 u[IU]/mL (ref 0.35–4.50)

## 2015-04-05 ENCOUNTER — Encounter: Payer: Self-pay | Admitting: Family Medicine

## 2015-04-05 ENCOUNTER — Ambulatory Visit (INDEPENDENT_AMBULATORY_CARE_PROVIDER_SITE_OTHER): Payer: BLUE CROSS/BLUE SHIELD | Admitting: Family Medicine

## 2015-04-05 VITALS — BP 98/64 | HR 64 | Temp 98.1°F | Ht 61.75 in | Wt 136.2 lb

## 2015-04-05 DIAGNOSIS — Z23 Encounter for immunization: Secondary | ICD-10-CM

## 2015-04-05 DIAGNOSIS — Z0000 Encounter for general adult medical examination without abnormal findings: Secondary | ICD-10-CM | POA: Diagnosis not present

## 2015-04-05 NOTE — Progress Notes (Signed)
Subjective:    Patient ID: Rebecca Peters, female    DOB: 1975/08/23, 39 y.o.   MRN: [id number]  HPI Here for health maintenance exam and to review chronic medical problems   Also for biometric screening form   Doing well overall  The area on her abdomen is still there/no bigger and no pain  Continue to observe it   Had a cortisone shot in R shoulder - for bursitis   Wt is up 2 lb with bmi of 25  HIV screening -does not think she is high risk   Flu shot -had today  Just had gyn visit 2 weeks ago - did not do pap this year  Last pap was 1 y ago -nl  Hx of colp in 2011  Had a mammogram in January-it was normal (has had 2- one for a b9 lump) Has fibrocystic change   Td 12/11   Results for orders placed or performed in visit on 03/30/15  CBC with Differential/Platelet  Result Value Ref Range   WBC 7.2 4.0 - 10.5 K/uL   RBC 4.25 3.87 - 5.11 Mil/uL   Hemoglobin 12.9 12.0 - 15.0 g/dL   HCT 38.9 36.0 - 46.0 %   MCV 91.6 78.0 - 100.0 fl   MCHC 33.2 30.0 - 36.0 g/dL   RDW 12.9 11.5 - 15.5 %   Platelets 237.0 150.0 - 400.0 K/uL   Neutrophils Relative % 59.8 43.0 - 77.0 %   Lymphocytes Relative 27.1 12.0 - 46.0 %   Monocytes Relative 7.7 3.0 - 12.0 %   Eosinophils Relative 4.4 0.0 - 5.0 %   Basophils Relative 1.0 0.0 - 3.0 %   Neutro Abs 4.3 1.4 - 7.7 K/uL   Lymphs Abs 2.0 0.7 - 4.0 K/uL   Monocytes Absolute 0.6 0.1 - 1.0 K/uL   Eosinophils Absolute 0.3 0.0 - 0.7 K/uL   Basophils Absolute 0.1 0.0 - 0.1 K/uL  Comprehensive metabolic panel  Result Value Ref Range   Sodium 137 135 - 145 mEq/L   Potassium 3.7 3.5 - 5.1 mEq/L   Chloride 103 96 - 112 mEq/L   CO2 27 19 - 32 mEq/L   Glucose, Bld 89 70 - 99 mg/dL   BUN 12 6 - 23 mg/dL   Creatinine, Ser 0.72 0.40 - 1.20 mg/dL   Total Bilirubin 0.4 0.2 - 1.2 mg/dL   Alkaline Phosphatase 61 39 - 117 U/L   AST 19 0 - 37 U/L   ALT 15 0 - 35 U/L   Total Protein 7.3 6.0 - 8.3 g/dL   Albumin 4.3 3.5 - 5.2 g/dL   Calcium  9.4 8.4 - 10.5 mg/dL   GFR 94.92 >60.00 mL/min  Lipid panel  Result Value Ref Range   Cholesterol 165 0 - 200 mg/dL   Triglycerides 91.0 0.0 - 149.0 mg/dL   HDL 51.00 >39.00 mg/dL   VLDL 18.2 0.0 - 40.0 mg/dL   LDL Cholesterol 96 0 - 99 mg/dL   Total CHOL/HDL Ratio 3    NonHDL 113.70   TSH  Result Value Ref Range   TSH 0.79 0.35 - 4.50 uIU/mL    Labs are excellent! She eats fairly healthy for the most part   Patient Active Problem List   Diagnosis Date Noted  . Fibrocystic disease of both breasts 06/20/2014  . Lump in the abdomen 06/20/2014  . Routine general medical examination at a health care facility 03/24/2011  . Screening for lipoid disorders 03/22/2011  .  Diabetes mellitus screening 03/22/2011  . DYSPLASIA OF CERVIX UNSPECIFIED 05/17/2010  . COCCYGEAL PAIN 05/15/2010  . SKIN RASH 05/15/2010  . SKIN CANCER, HX OF 05/15/2010   Past Medical History  Diagnosis Date  . Late menses     long cycle---menses every 6 weeks  . Personal history of other malignant neoplasm of skin   . Dysplasia of cervix, unspecified    Past Surgical History  Procedure Laterality Date  . Appendectomy  5/10  . Melanoma excision  10/11    lymphnodes stage 2; wide excision with 2 LN removed  . Colposcopy  12/11   Social History  Substance Use Topics  . Smoking status: Never Smoker   . Smokeless tobacco: Never Used  . Alcohol Use: 0.0 oz/week    0 Standard drinks or equivalent per week     Comment: 1 drink every few months   Family History  Problem Relation Age of Onset  . Hyperlipidemia Mother   . Hyperlipidemia Father   . Hypertension Father   . Cancer Neg Hx    Allergies  Allergen Reactions  . Metoclopramide Hcl     REACTION: trouble breathing and muscle spasms to face   No current outpatient prescriptions on file prior to visit.   No current facility-administered medications on file prior to visit.    Review of Systems Review of Systems  Constitutional: Negative for  fever, appetite change, fatigue and unexpected weight change.  Eyes: Negative for pain and visual disturbance.  Respiratory: Negative for cough and shortness of breath.   Cardiovascular: Negative for cp or palpitations    Gastrointestinal: Negative for nausea, diarrhea and constipation.  Genitourinary: Negative for urgency and frequency.  Skin: Negative for pallor or rash   Neurological: Negative for weakness, light-headedness, numbness and headaches.  Hematological: Negative for adenopathy. Does not bruise/bleed easily.  Psychiatric/Behavioral: Negative for dysphoric mood. The patient is not nervous/anxious.         Objective:   Physical Exam  Constitutional: She appears well-developed and well-nourished. No distress.  Well appearing   HENT:  Head: Normocephalic and atraumatic.  Right Ear: External ear normal.  Left Ear: External ear normal.  Nose: Nose normal.  Mouth/Throat: Oropharynx is clear and moist.  Eyes: Conjunctivae and EOM are normal. Pupils are equal, round, and reactive to light. Right eye exhibits no discharge. Left eye exhibits no discharge. No scleral icterus.  Neck: Normal range of motion. Neck supple. No JVD present. Carotid bruit is not present. No thyromegaly present.  Cardiovascular: Normal rate, regular rhythm, normal heart sounds and intact distal pulses.  Exam reveals no gallop.   Pulmonary/Chest: Effort normal and breath sounds normal. No respiratory distress. She has no wheezes. She has no rales.  Abdominal: Soft. Bowel sounds are normal. She exhibits no distension and no mass. There is no tenderness.  Musculoskeletal: She exhibits no edema or tenderness.  Lymphadenopathy:    She has no cervical adenopathy.  Neurological: She is alert. She has normal reflexes. No cranial nerve deficit. She exhibits normal muscle tone. Coordination normal.  Skin: Skin is warm and dry. No rash noted. No erythema. No pallor.  Few solar lentigo  Psychiatric: She has a normal  mood and affect.          Assessment & Plan:   Problem List Items Addressed This Visit      Other   Routine general medical examination at a health care facility    Reviewed health habits including diet and exercise and  skin cancer prevention Reviewed appropriate screening tests for age  Also reviewed health mt list, fam hx and immunization status , as well as social and family history   See HPI Labs reviewed  Has had gyn visit and mammogram        Other Visit Diagnoses    Need for influenza vaccination    -  Primary    Relevant Orders    Flu Vaccine QUAD 36+ mos PF IM (Fluarix & Fluzone Quad PF) (Completed)

## 2015-04-05 NOTE — Patient Instructions (Signed)
Take care of yourself Flu shot today  Labs look good

## 2015-04-05 NOTE — Progress Notes (Signed)
Pre visit review using our clinic review tool, if applicable. No additional management support is needed unless otherwise documented below in the visit note. 

## 2015-04-06 NOTE — Assessment & Plan Note (Signed)
Reviewed health habits including diet and exercise and skin cancer prevention Reviewed appropriate screening tests for age  Also reviewed health mt list, fam hx and immunization status , as well as social and family history   See HPI Labs reviewed  Has had gyn visit and mammogram

## 2015-09-27 DIAGNOSIS — Z1231 Encounter for screening mammogram for malignant neoplasm of breast: Secondary | ICD-10-CM | POA: Diagnosis not present

## 2016-01-19 DIAGNOSIS — D235 Other benign neoplasm of skin of trunk: Secondary | ICD-10-CM | POA: Diagnosis not present

## 2016-01-19 DIAGNOSIS — L578 Other skin changes due to chronic exposure to nonionizing radiation: Secondary | ICD-10-CM | POA: Diagnosis not present

## 2016-01-19 DIAGNOSIS — D225 Melanocytic nevi of trunk: Secondary | ICD-10-CM | POA: Diagnosis not present

## 2016-01-19 DIAGNOSIS — D485 Neoplasm of uncertain behavior of skin: Secondary | ICD-10-CM | POA: Diagnosis not present

## 2016-01-19 DIAGNOSIS — Z85820 Personal history of malignant melanoma of skin: Secondary | ICD-10-CM | POA: Diagnosis not present

## 2016-01-19 DIAGNOSIS — D229 Melanocytic nevi, unspecified: Secondary | ICD-10-CM | POA: Diagnosis not present

## 2016-01-19 DIAGNOSIS — Z85828 Personal history of other malignant neoplasm of skin: Secondary | ICD-10-CM | POA: Diagnosis not present

## 2016-03-24 ENCOUNTER — Telehealth: Payer: Self-pay | Admitting: Family Medicine

## 2016-03-24 DIAGNOSIS — Z0000 Encounter for general adult medical examination without abnormal findings: Secondary | ICD-10-CM

## 2016-03-24 NOTE — Telephone Encounter (Signed)
-----   Message from Marchia Bond sent at 03/22/2016  1:09 PM EDT ----- Regarding: Cpx labs Mon 11/13, need orders. Thanks! :-) Please order  future cpx labs for pt's upcoming lab appt. Thanks Aniceto Boss

## 2016-04-01 ENCOUNTER — Other Ambulatory Visit: Payer: BLUE CROSS/BLUE SHIELD

## 2016-04-08 ENCOUNTER — Encounter: Payer: BLUE CROSS/BLUE SHIELD | Admitting: Family Medicine

## 2016-04-30 DIAGNOSIS — Z8041 Family history of malignant neoplasm of ovary: Secondary | ICD-10-CM | POA: Diagnosis not present

## 2016-04-30 DIAGNOSIS — Z01419 Encounter for gynecological examination (general) (routine) without abnormal findings: Secondary | ICD-10-CM | POA: Diagnosis not present

## 2016-04-30 DIAGNOSIS — Z1231 Encounter for screening mammogram for malignant neoplasm of breast: Secondary | ICD-10-CM | POA: Diagnosis not present

## 2016-04-30 DIAGNOSIS — Z87898 Personal history of other specified conditions: Secondary | ICD-10-CM | POA: Diagnosis not present

## 2016-05-01 ENCOUNTER — Other Ambulatory Visit: Payer: Self-pay | Admitting: Obstetrics & Gynecology

## 2016-05-01 DIAGNOSIS — Z1231 Encounter for screening mammogram for malignant neoplasm of breast: Secondary | ICD-10-CM

## 2016-05-21 LAB — HM PAP SMEAR

## 2016-06-10 ENCOUNTER — Ambulatory Visit: Payer: BLUE CROSS/BLUE SHIELD

## 2016-06-24 ENCOUNTER — Ambulatory Visit
Admission: RE | Admit: 2016-06-24 | Discharge: 2016-06-24 | Disposition: A | Discharge status: Home / Self Care | Payer: BLUE CROSS/BLUE SHIELD | Source: Ambulatory Visit | Attending: Obstetrics & Gynecology | Admitting: Obstetrics & Gynecology

## 2016-06-24 ENCOUNTER — Other Ambulatory Visit: Payer: Self-pay | Admitting: Obstetrics & Gynecology

## 2016-06-24 DIAGNOSIS — Z1231 Encounter for screening mammogram for malignant neoplasm of breast: Secondary | ICD-10-CM | POA: Insufficient documentation

## 2016-07-03 ENCOUNTER — Inpatient Hospital Stay
Admission: RE | Admit: 2016-07-03 | Discharge: 2016-07-03 | Disposition: A | Discharge status: Home / Self Care | Payer: Self-pay | Source: Ambulatory Visit | Attending: *Deleted | Admitting: *Deleted

## 2016-07-03 ENCOUNTER — Other Ambulatory Visit: Payer: Self-pay | Admitting: *Deleted

## 2016-07-03 DIAGNOSIS — Z9289 Personal history of other medical treatment: Secondary | ICD-10-CM

## 2017-02-04 DIAGNOSIS — Z1283 Encounter for screening for malignant neoplasm of skin: Secondary | ICD-10-CM | POA: Diagnosis not present

## 2017-02-04 DIAGNOSIS — L432 Lichenoid drug reaction: Secondary | ICD-10-CM | POA: Diagnosis not present

## 2017-02-04 DIAGNOSIS — Z85828 Personal history of other malignant neoplasm of skin: Secondary | ICD-10-CM | POA: Diagnosis not present

## 2017-02-04 DIAGNOSIS — D485 Neoplasm of uncertain behavior of skin: Secondary | ICD-10-CM | POA: Diagnosis not present

## 2017-02-04 DIAGNOSIS — Z85820 Personal history of malignant melanoma of skin: Secondary | ICD-10-CM | POA: Diagnosis not present

## 2017-02-04 DIAGNOSIS — L570 Actinic keratosis: Secondary | ICD-10-CM | POA: Diagnosis not present

## 2017-02-04 DIAGNOSIS — D225 Melanocytic nevi of trunk: Secondary | ICD-10-CM | POA: Diagnosis not present

## 2017-02-04 DIAGNOSIS — D229 Melanocytic nevi, unspecified: Secondary | ICD-10-CM | POA: Diagnosis not present

## 2017-04-16 ENCOUNTER — Telehealth: Payer: Self-pay | Admitting: Family Medicine

## 2017-04-16 DIAGNOSIS — Z0000 Encounter for general adult medical examination without abnormal findings: Secondary | ICD-10-CM

## 2017-04-16 NOTE — Telephone Encounter (Signed)
-----   Message from Ellamae Sia sent at 04/16/2017 10:48 AM EST ----- Regarding: Lab orders for Monday, 12.3.18 Patient is scheduled for CPX labs, please order future labs, Thanks , Karna Christmas

## 2017-04-17 ENCOUNTER — Telehealth: Payer: Self-pay | Admitting: Family Medicine

## 2017-04-17 NOTE — Telephone Encounter (Signed)
-----   Message from Ellamae Sia sent at 04/16/2017 10:48 AM EST ----- Regarding: Lab orders for Monday, 12.3.18 Patient is scheduled for CPX labs, please order future labs, Thanks , Karna Christmas

## 2017-04-17 NOTE — Telephone Encounter (Signed)
error 

## 2017-04-21 ENCOUNTER — Other Ambulatory Visit: Payer: BLUE CROSS/BLUE SHIELD

## 2017-04-22 ENCOUNTER — Other Ambulatory Visit (INDEPENDENT_AMBULATORY_CARE_PROVIDER_SITE_OTHER): Payer: BLUE CROSS/BLUE SHIELD

## 2017-04-22 DIAGNOSIS — Z0000 Encounter for general adult medical examination without abnormal findings: Secondary | ICD-10-CM

## 2017-04-22 LAB — CBC WITH DIFFERENTIAL/PLATELET
Basophils Absolute: 0.1 10*3/uL (ref 0.0–0.1)
Basophils Relative: 1.7 % (ref 0.0–3.0)
EOS PCT: 6 % — AB (ref 0.0–5.0)
Eosinophils Absolute: 0.4 10*3/uL (ref 0.0–0.7)
HCT: 39 % (ref 36.0–46.0)
Hemoglobin: 13.1 g/dL (ref 12.0–15.0)
LYMPHS ABS: 1.8 10*3/uL (ref 0.7–4.0)
Lymphocytes Relative: 30 % (ref 12.0–46.0)
MCHC: 33.6 g/dL (ref 30.0–36.0)
MCV: 91.9 fl (ref 78.0–100.0)
MONO ABS: 0.5 10*3/uL (ref 0.1–1.0)
MONOS PCT: 7.8 % (ref 3.0–12.0)
Neutro Abs: 3.2 10*3/uL (ref 1.4–7.7)
Neutrophils Relative %: 54.5 % (ref 43.0–77.0)
Platelets: 268 10*3/uL (ref 150.0–400.0)
RBC: 4.24 Mil/uL (ref 3.87–5.11)
RDW: 12.3 % (ref 11.5–15.5)
WBC: 5.9 10*3/uL (ref 4.0–10.5)

## 2017-04-22 LAB — COMPREHENSIVE METABOLIC PANEL
ALK PHOS: 64 U/L (ref 39–117)
ALT: 14 U/L (ref 0–35)
AST: 18 U/L (ref 0–37)
Albumin: 4.2 g/dL (ref 3.5–5.2)
BUN: 14 mg/dL (ref 6–23)
CHLORIDE: 104 meq/L (ref 96–112)
CO2: 29 mEq/L (ref 19–32)
Calcium: 9.2 mg/dL (ref 8.4–10.5)
Creatinine, Ser: 0.82 mg/dL (ref 0.40–1.20)
GFR: 80.88 mL/min (ref >60.00)
GLUCOSE: 91 mg/dL (ref 70–99)
Potassium: 3.9 mEq/L (ref 3.5–5.1)
SODIUM: 139 meq/L (ref 135–145)
TOTAL PROTEIN: 7.3 g/dL (ref 6.0–8.3)
Total Bilirubin: 0.6 mg/dL (ref 0.2–1.2)

## 2017-04-22 LAB — LIPID PANEL
Cholesterol: 183 mg/dL (ref 0–200)
HDL: 59.6 mg/dL (ref >39.00)
LDL CALC: 109 mg/dL — AB (ref 0–99)
NONHDL: 122.93
Total CHOL/HDL Ratio: 3
Triglycerides: 69 mg/dL (ref 0.0–149.0)
VLDL: 13.8 mg/dL (ref 0.0–40.0)

## 2017-04-22 LAB — TSH: TSH: 2.77 u[IU]/mL (ref 0.35–4.50)

## 2017-04-23 ENCOUNTER — Encounter: Payer: Self-pay | Admitting: Family Medicine

## 2017-04-23 ENCOUNTER — Ambulatory Visit (INDEPENDENT_AMBULATORY_CARE_PROVIDER_SITE_OTHER): Payer: BLUE CROSS/BLUE SHIELD | Admitting: Family Medicine

## 2017-04-23 VITALS — BP 110/68 | HR 79 | Temp 98.0°F | Ht 61.75 in | Wt 144.8 lb

## 2017-04-23 DIAGNOSIS — Z0000 Encounter for general adult medical examination without abnormal findings: Secondary | ICD-10-CM

## 2017-04-23 DIAGNOSIS — Z85828 Personal history of other malignant neoplasm of skin: Secondary | ICD-10-CM

## 2017-04-23 DIAGNOSIS — Z23 Encounter for immunization: Secondary | ICD-10-CM | POA: Diagnosis not present

## 2017-04-23 DIAGNOSIS — R1902 Left upper quadrant abdominal swelling, mass and lump: Secondary | ICD-10-CM | POA: Diagnosis not present

## 2017-04-23 NOTE — Progress Notes (Signed)
Subjective:    Patient ID: Rebecca Peters, female    DOB: 1975-06-11, 41 y.o.   MRN: [id number]  HPI Here for health maintenance exam and to review chronic medical problems    Feels sluggish in general  Earlier in the year she exercised and lost wt - then not so great in the summer heat (got intertrigo under breasts)  Flu vaccine -needs today   Pap 11/17  Gyn -nl pap -will f/u in jan  Menses- regular / last 2 d and not a problem  No need for contraception  Remote hx of cervical dysplasia   Mammogram 2/18 neg  Self breast exam - no lumps  MGM with breast cancer   Mother had cancer (ovarian) - re occurrence  Genetic testing was neg   Tetanus shot 12/11  Wt Readings from Last 3 Encounters:  04/23/17 144 lb 12 oz (65.7 kg)  04/05/15 136 lb 4 oz (61.8 kg)  06/20/14 134 lb 12.8 oz (61.1 kg)  likes to hike  Time to start back exercising (has the beach body program) Eats pretty healthy but likes chocolate  26.69 kg/m   Remote hx of melanoma excision Last derm visit was early nov - removed several bothersome moles - one had atypical cells  Uses sunscreen   Cholesterol Lab Results  Component Value Date   CHOL 183 04/22/2017   CHOL 165 03/30/2015   CHOL 167 03/28/2014   Lab Results  Component Value Date   HDL 59.60 04/22/2017   HDL 51.00 03/30/2015   HDL 54.90 03/28/2014   Lab Results  Component Value Date   LDLCALC 109 (H) 04/22/2017   LDLCALC 96 03/30/2015   LDLCALC 98 03/28/2014   Lab Results  Component Value Date   TRIG 69.0 04/22/2017   TRIG 91.0 03/30/2015   TRIG 71.0 03/28/2014   Lab Results  Component Value Date   CHOLHDL 3 04/22/2017   CHOLHDL 3 03/30/2015   CHOLHDL 3 03/28/2014   No results found for: LDLDIRECT Good profile    Other labs: Results for orders placed or performed in visit on 04/22/17  TSH  Result Value Ref Range   TSH 2.77 0.35 - 4.50 uIU/mL  Lipid panel  Result Value Ref Range   Cholesterol 183 0 - 200 mg/dL   Triglycerides 69.0 0.0 - 149.0 mg/dL   HDL 59.60 >39.00 mg/dL   VLDL 13.8 0.0 - 40.0 mg/dL   LDL Cholesterol 109 (H) 0 - 99 mg/dL   Total CHOL/HDL Ratio 3    NonHDL 122.93   Comprehensive metabolic panel  Result Value Ref Range   Sodium 139 135 - 145 mEq/L   Potassium 3.9 3.5 - 5.1 mEq/L   Chloride 104 96 - 112 mEq/L   CO2 29 19 - 32 mEq/L   Glucose, Bld 91 70 - 99 mg/dL   BUN 14 6 - 23 mg/dL   Creatinine, Ser 0.82 0.40 - 1.20 mg/dL   Total Bilirubin 0.6 0.2 - 1.2 mg/dL   Alkaline Phosphatase 64 39 - 117 U/L   AST 18 0 - 37 U/L   ALT 14 0 - 35 U/L   Total Protein 7.3 6.0 - 8.3 g/dL   Albumin 4.2 3.5 - 5.2 g/dL   Calcium 9.2 8.4 - 10.5 mg/dL   GFR 80.88 >60.00 mL/min  CBC with Differential/Platelet  Result Value Ref Range   WBC 5.9 4.0 - 10.5 K/uL   RBC 4.24 3.87 - 5.11 Mil/uL   Hemoglobin 13.1 12.0 -  15.0 g/dL   HCT 39.0 36.0 - 46.0 %   MCV 91.9 78.0 - 100.0 fl   MCHC 33.6 30.0 - 36.0 g/dL   RDW 12.3 11.5 - 15.5 %   Platelets 268.0 150.0 - 400.0 K/uL   Neutrophils Relative % 54.5 43.0 - 77.0 %   Lymphocytes Relative 30.0 12.0 - 46.0 %   Monocytes Relative 7.8 3.0 - 12.0 %   Eosinophils Relative 6.0 (H) 0.0 - 5.0 %   Basophils Relative 1.7 0.0 - 3.0 %   Neutro Abs 3.2 1.4 - 7.7 K/uL   Lymphs Abs 1.8 0.7 - 4.0 K/uL   Monocytes Absolute 0.5 0.1 - 1.0 K/uL   Eosinophils Absolute 0.4 0.0 - 0.7 K/uL   Basophils Absolute 0.1 0.0 - 0.1 K/uL      Patient Active Problem List   Diagnosis Date Noted  . Fibrocystic disease of both breasts 06/20/2014  . Lump in the abdomen 06/20/2014  . Routine general medical examination at a health care facility 03/24/2011  . Screening for lipoid disorders 03/22/2011  . Diabetes mellitus screening 03/22/2011  . DYSPLASIA OF CERVIX UNSPECIFIED 05/17/2010  . COCCYGEAL PAIN 05/15/2010  . SKIN RASH 05/15/2010  . SKIN CANCER, HX OF 05/15/2010   Past Medical History:  Diagnosis Date  . Dysplasia of cervix, unspecified   . Late menses     long cycle---menses every 6 weeks  . Personal history of other malignant neoplasm of skin    Past Surgical History:  Procedure Laterality Date  . APPENDECTOMY  5/10  . COLPOSCOPY  12/11  . MELANOMA EXCISION  10/11   lymphnodes stage 2; wide excision with 2 LN removed   Social History   Tobacco Use  . Smoking status: Never Smoker  . Smokeless tobacco: Never Used  Substance Use Topics  . Alcohol use: Yes    Alcohol/week: 0.0 oz    Comment: 1 drink every few months  . Drug use: No   Family History  Problem Relation Age of Onset  . Hyperlipidemia Father   . Hypertension Father   . Hyperlipidemia Mother   . Breast cancer Maternal Grandmother   . Cancer Neg Hx    Allergies  Allergen Reactions  . Metoclopramide Hcl     REACTION: trouble breathing and muscle spasms to face   No current outpatient medications on file prior to visit.   No current facility-administered medications on file prior to visit.      Review of Systems  Constitutional: Positive for fatigue. Negative for activity change, appetite change, fever and unexpected weight change.  HENT: Negative for congestion, ear pain, rhinorrhea, sinus pressure and sore throat.   Eyes: Negative for pain, redness and visual disturbance.  Respiratory: Negative for cough, shortness of breath and wheezing.   Cardiovascular: Negative for chest pain and palpitations.  Gastrointestinal: Negative for abdominal pain, blood in stool, constipation and diarrhea.  Endocrine: Negative for polydipsia and polyuria.  Genitourinary: Negative for dysuria, frequency and urgency.  Musculoskeletal: Negative for arthralgias, back pain and myalgias.  Skin: Negative for pallor and rash.  Allergic/Immunologic: Negative for environmental allergies.  Neurological: Negative for dizziness, syncope and headaches.  Hematological: Negative for adenopathy. Does not bruise/bleed easily.  Psychiatric/Behavioral: Negative for decreased concentration and  dysphoric mood. The patient is not nervous/anxious.        Objective:   Physical Exam  Constitutional: She appears well-developed and well-nourished. No distress.  Well appearing   HENT:  Head: Normocephalic and atraumatic.  Right Ear:  External ear normal.  Left Ear: External ear normal.  Nose: Nose normal.  Mouth/Throat: Oropharynx is clear and moist.  Eyes: Conjunctivae and EOM are normal. Pupils are equal, round, and reactive to light. Right eye exhibits no discharge. Left eye exhibits no discharge. No scleral icterus.  Neck: Normal range of motion. Neck supple. No JVD present. Carotid bruit is not present. No thyromegaly present.  Cardiovascular: Normal rate, regular rhythm, normal heart sounds and intact distal pulses. Exam reveals no gallop.  Pulmonary/Chest: Effort normal and breath sounds normal. No respiratory distress. She has no wheezes. She has no rales.  Abdominal: Soft. Bowel sounds are normal. She exhibits no distension and no mass. There is no tenderness.  Musculoskeletal: She exhibits no edema or tenderness.  Lymphadenopathy:    She has no cervical adenopathy.  Neurological: She is alert. She has normal reflexes. No cranial nerve deficit. She exhibits normal muscle tone. Coordination normal.  Skin: Skin is warm and dry. No rash noted. No erythema. No pallor.  Solar lentigines diffusely   Psychiatric: She has a normal mood and affect.          Assessment & Plan:   Problem List Items Addressed This Visit      Other   Lump in the abdomen    No change in exam today      Routine general medical examination at a health care facility - Primary    Reviewed health habits including diet and exercise and skin cancer prevention Reviewed appropriate screening tests for age  Also reviewed health mt list, fam hx and immunization status , as well as social and family history   Labs reviewed  Enc exercise  Flu shot today  Disc diet for better cholesterol  Pt will  schedule gyn visit        Other Visit Diagnoses    Need for influenza vaccination       Relevant Orders   Flu Vaccine QUAD 6+ mos PF IM (Fluarix Quad PF) (Completed)

## 2017-04-23 NOTE — Patient Instructions (Addendum)
Plan the time to start back with exercise   Don't forget to schedule your gyn/mammogram appt in feb   For cholesterol :   Avoid red meat/ fried foods/ egg yolks/ fatty breakfast meats/ butter, cheese and high fat dairy/ and shellfish    Take care of yourself   Flu shot today

## 2017-04-24 NOTE — Assessment & Plan Note (Signed)
Reviewed health habits including diet and exercise and skin cancer prevention Reviewed appropriate screening tests for age  Also reviewed health mt list, fam hx and immunization status , as well as social and family history   Labs reviewed  Enc exercise  Flu shot today  Disc diet for better cholesterol  Pt will schedule gyn visit

## 2017-04-24 NOTE — Assessment & Plan Note (Signed)
No change in exam today

## 2017-04-24 NOTE — Assessment & Plan Note (Signed)
Continues regular derm visits for personal hx of melanoma  No recent problems  Wears sun protection

## 2017-08-03 ENCOUNTER — Encounter: Payer: Self-pay | Admitting: Family Medicine

## 2017-08-07 DIAGNOSIS — H1045 Other chronic allergic conjunctivitis: Secondary | ICD-10-CM | POA: Diagnosis not present

## 2017-08-11 ENCOUNTER — Other Ambulatory Visit: Payer: Self-pay | Admitting: Obstetrics & Gynecology

## 2017-08-11 DIAGNOSIS — Z1231 Encounter for screening mammogram for malignant neoplasm of breast: Secondary | ICD-10-CM

## 2017-09-02 ENCOUNTER — Ambulatory Visit
Admission: RE | Admit: 2017-09-02 | Discharge: 2017-09-02 | Disposition: A | Discharge status: Home / Self Care | Payer: BLUE CROSS/BLUE SHIELD | Source: Ambulatory Visit | Attending: Obstetrics & Gynecology | Admitting: Obstetrics & Gynecology

## 2017-09-02 DIAGNOSIS — Z1231 Encounter for screening mammogram for malignant neoplasm of breast: Secondary | ICD-10-CM | POA: Insufficient documentation

## 2017-09-02 HISTORY — DX: Malignant (primary) neoplasm, unspecified: C80.1

## 2018-02-23 ENCOUNTER — Telehealth: Payer: Self-pay | Admitting: Family Medicine

## 2018-02-23 DIAGNOSIS — Z0000 Encounter for general adult medical examination without abnormal findings: Secondary | ICD-10-CM

## 2018-02-23 NOTE — Telephone Encounter (Signed)
-----   Message from Lendon Collar, RT sent at 02/23/2018  9:01 AM EDT ----- Regarding: Lab orders for Monday 03/02/18 Please enter CPE lab orders for 03/02/18. Thanks!

## 2018-03-02 ENCOUNTER — Other Ambulatory Visit: Payer: BLUE CROSS/BLUE SHIELD

## 2018-03-03 ENCOUNTER — Other Ambulatory Visit (INDEPENDENT_AMBULATORY_CARE_PROVIDER_SITE_OTHER): Payer: BLUE CROSS/BLUE SHIELD

## 2018-03-03 ENCOUNTER — Other Ambulatory Visit: Payer: BLUE CROSS/BLUE SHIELD

## 2018-03-03 DIAGNOSIS — Z0000 Encounter for general adult medical examination without abnormal findings: Secondary | ICD-10-CM | POA: Diagnosis not present

## 2018-03-04 LAB — CBC WITH DIFFERENTIAL/PLATELET
Basophils Absolute: 0.1 10*3/uL (ref 0.0–0.1)
Basophils Relative: 1.2 % (ref 0.0–3.0)
Eosinophils Absolute: 0.3 10*3/uL (ref 0.0–0.7)
Eosinophils Relative: 4.8 % (ref 0.0–5.0)
HEMATOCRIT: 37.7 % (ref 36.0–46.0)
HEMOGLOBIN: 12.6 g/dL (ref 12.0–15.0)
LYMPHS PCT: 28.2 % (ref 12.0–46.0)
Lymphs Abs: 2.1 10*3/uL (ref 0.7–4.0)
MCHC: 33.5 g/dL (ref 30.0–36.0)
MCV: 91.4 fl (ref 78.0–100.0)
MONOS PCT: 6.9 % (ref 3.0–12.0)
Monocytes Absolute: 0.5 10*3/uL (ref 0.1–1.0)
Neutro Abs: 4.3 10*3/uL (ref 1.4–7.7)
Neutrophils Relative %: 58.9 % (ref 43.0–77.0)
Platelets: 249 10*3/uL (ref 150.0–400.0)
RBC: 4.13 Mil/uL (ref 3.87–5.11)
RDW: 12.6 % (ref 11.5–15.5)
WBC: 7.3 10*3/uL (ref 4.0–10.5)

## 2018-03-04 LAB — COMPREHENSIVE METABOLIC PANEL
ALBUMIN: 4.2 g/dL (ref 3.5–5.2)
ALT: 8 U/L (ref 0–35)
AST: 13 U/L (ref 0–37)
Alkaline Phosphatase: 56 U/L (ref 39–117)
BILIRUBIN TOTAL: 0.2 mg/dL (ref 0.2–1.2)
BUN: 14 mg/dL (ref 6–23)
CALCIUM: 9.1 mg/dL (ref 8.4–10.5)
CHLORIDE: 102 meq/L (ref 96–112)
CO2: 28 mEq/L (ref 19–32)
Creatinine, Ser: 0.84 mg/dL (ref 0.40–1.20)
GFR: 78.34 mL/min (ref >60.00)
Glucose, Bld: 91 mg/dL (ref 70–99)
Potassium: 3.9 mEq/L (ref 3.5–5.1)
Sodium: 136 mEq/L (ref 135–145)
Total Protein: 7.2 g/dL (ref 6.0–8.3)

## 2018-03-04 LAB — LIPID PANEL
CHOLESTEROL: 155 mg/dL (ref 0–200)
HDL: 54.4 mg/dL (ref >39.00)
LDL CALC: 82 mg/dL (ref 0–99)
NonHDL: 100.43
TRIGLYCERIDES: 94 mg/dL (ref 0.0–149.0)
Total CHOL/HDL Ratio: 3
VLDL: 18.8 mg/dL (ref 0.0–40.0)

## 2018-03-04 LAB — TSH: TSH: 1.26 u[IU]/mL (ref 0.35–4.50)

## 2018-03-09 ENCOUNTER — Ambulatory Visit (INDEPENDENT_AMBULATORY_CARE_PROVIDER_SITE_OTHER): Payer: BLUE CROSS/BLUE SHIELD | Admitting: Family Medicine

## 2018-03-09 ENCOUNTER — Encounter: Payer: Self-pay | Admitting: Family Medicine

## 2018-03-09 VITALS — BP 110/66 | HR 65 | Temp 98.3°F | Ht 61.5 in | Wt 147.0 lb

## 2018-03-09 DIAGNOSIS — R1902 Left upper quadrant abdominal swelling, mass and lump: Secondary | ICD-10-CM

## 2018-03-09 DIAGNOSIS — Z23 Encounter for immunization: Secondary | ICD-10-CM

## 2018-03-09 DIAGNOSIS — Z0000 Encounter for general adult medical examination without abnormal findings: Secondary | ICD-10-CM | POA: Diagnosis not present

## 2018-03-09 NOTE — Assessment & Plan Note (Signed)
No clinical changes  Rev CT report from 2016

## 2018-03-09 NOTE — Assessment & Plan Note (Signed)
Reviewed health habits including diet and exercise and skin cancer prevention Reviewed appropriate screening tests for age  Also reviewed health mt list, fam hx and immunization status , as well as social and family history   See HPI Labs reviewed  Planing f/u with gyn and dermatology  Nl breast exam today  Lump in L abd is clinically unchanged  Flu shot today  Enc self care incl finding time for exercise  Labs are re assuring

## 2018-03-09 NOTE — Progress Notes (Signed)
Subjective:    Patient ID: Rebecca Peters, female    DOB: Oct 03, 1975, 42 y.o.   MRN: [id number]  HPI Here for health maintenance exam and to review chronic medical problems    Tired  Has traveled the past 2 weekends  For her daughter's bike races  Getting home very late   Sole of R foot bothers her after walking in flimsy shoes the past 2 weekends   Wt Readings from Last 3 Encounters:  03/09/18 147 lb (66.7 kg)  04/23/17 144 lb 12 oz (65.7 kg)  04/05/15 136 lb 4 oz (61.8 kg)  wt is up 11 lb  Eats fairly healthy - less bread and processed carbs  Not a big snacker  Exercise- comes and goes- good routine until heavy travel weekend  27.33 kg/m   Flu shot given today   Pap 1/18 - gyn /due for a visit (sees dr Leonides Schanz)  Janeece Fitting / regular (heavy the first day)- last 3 days  Does not need contraception   Mother had ovarian cancer   Tetanus shot 12/11  Mammogram 4/19  Self breast exam - no breast lumps   Derm f/u (remote hx of melanoma)  About a year ago  Wears sunscreen   Blood pressure BP Readings from Last 3 Encounters:  03/09/18 110/66  04/23/17 110/68  04/05/15 98/64   Pulse Readings from Last 3 Encounters:  03/09/18 65  04/23/17 79  04/05/15 64    Cholesterol Lab Results  Component Value Date   CHOL 155 03/03/2018   CHOL 183 04/22/2017   CHOL 165 03/30/2015   Lab Results  Component Value Date   HDL 54.40 03/03/2018   HDL 59.60 04/22/2017   HDL 51.00 03/30/2015   Lab Results  Component Value Date   LDLCALC 82 03/03/2018   LDLCALC 109 (H) 04/22/2017   LDLCALC 96 03/30/2015   Lab Results  Component Value Date   TRIG 94.0 03/03/2018   TRIG 69.0 04/22/2017   TRIG 91.0 03/30/2015   Lab Results  Component Value Date   CHOLHDL 3 03/03/2018   CHOLHDL 3 04/22/2017   CHOLHDL 3 03/30/2015   No results found for: LDLDIRECT Improved LDL and good HDL   Other labs Lab Results  Component Value Date   CREATININE 0.84 03/03/2018   BUN 14  03/03/2018   NA 136 03/03/2018   K 3.9 03/03/2018   CL 102 03/03/2018   CO2 28 03/03/2018   Lab Results  Component Value Date   ALT 8 03/03/2018   AST 13 03/03/2018   ALKPHOS 56 03/03/2018   BILITOT 0.2 03/03/2018   Lab Results  Component Value Date   WBC 7.3 03/03/2018   HGB 12.6 03/03/2018   HCT 37.7 03/03/2018   MCV 91.4 03/03/2018   PLT 249.0 03/03/2018   Lab Results  Component Value Date   TSH 1.26 03/03/2018   glucose 91 fasting    Patient Active Problem List   Diagnosis Date Noted  . Fibrocystic disease of both breasts 06/20/2014  . Lump in the abdomen 06/20/2014  . Routine general medical examination at a health care facility 03/24/2011  . Screening for lipoid disorders 03/22/2011  . Diabetes mellitus screening 03/22/2011  . H/O abnormal cervical Papanicolaou smear 05/17/2010  . COCCYGEAL PAIN 05/15/2010  . SKIN CANCER, HX OF 05/15/2010   Past Medical History:  Diagnosis Date  . Cancer ([place])    melonoma  . Dysplasia of cervix, unspecified   . Late menses  long cycle---menses every 6 weeks  . Personal history of other malignant neoplasm of skin    Past Surgical History:  Procedure Laterality Date  . APPENDECTOMY  5/10  . COLPOSCOPY  12/11  . MELANOMA EXCISION  10/11   lymphnodes stage 2; wide excision with 2 LN removed   Social History   Tobacco Use  . Smoking status: Never Smoker  . Smokeless tobacco: Never Used  Substance Use Topics  . Alcohol use: Yes    Alcohol/week: 0.0 standard drinks    Comment: 1 drink every few months  . Drug use: No   Family History  Problem Relation Age of Onset  . Hyperlipidemia Father   . Hypertension Father   . Hyperlipidemia Mother   . Breast cancer Maternal Grandmother   . Cancer Neg Hx    Allergies  Allergen Reactions  . Metoclopramide Hcl     REACTION: trouble breathing and muscle spasms to face   No current outpatient medications on file prior to visit.   No current facility-administered  medications on file prior to visit.     Review of Systems  Constitutional: Positive for fatigue. Negative for activity change, appetite change, fever and unexpected weight change.  HENT: Negative for congestion, ear pain, rhinorrhea, sinus pressure and sore throat.   Eyes: Negative for pain, redness and visual disturbance.  Respiratory: Negative for cough, shortness of breath and wheezing.   Cardiovascular: Negative for chest pain and palpitations.  Gastrointestinal: Negative for abdominal pain, blood in stool, constipation and diarrhea.  Endocrine: Negative for polydipsia and polyuria.  Genitourinary: Negative for dysuria, frequency and urgency.  Musculoskeletal: Negative for arthralgias, back pain, joint swelling and myalgias.       Right foot pain   Skin: Negative for pallor and rash.  Allergic/Immunologic: Negative for environmental allergies.  Neurological: Negative for dizziness, syncope and headaches.  Hematological: Negative for adenopathy. Does not bruise/bleed easily.  Psychiatric/Behavioral: Negative for decreased concentration and dysphoric mood. The patient is not nervous/anxious.        Objective:   Physical Exam  Constitutional: She appears well-developed and well-nourished. No distress.  overwt and well appearing  HENT:  Head: Normocephalic and atraumatic.  Right Ear: External ear normal.  Left Ear: External ear normal.  Mouth/Throat: Oropharynx is clear and moist.  Eyes: Pupils are equal, round, and reactive to light. Conjunctivae and EOM are normal. No scleral icterus.  Neck: Normal range of motion. Neck supple. No JVD present. Carotid bruit is not present. No thyromegaly present.  Cardiovascular: Normal rate, regular rhythm, normal heart sounds and intact distal pulses. Exam reveals no gallop.  Pulmonary/Chest: Effort normal and breath sounds normal. No respiratory distress. She has no wheezes. She has no rales. She exhibits no tenderness. No breast tenderness,  discharge or bleeding.  Abdominal: Soft. Bowel sounds are normal. She exhibits no distension, no abdominal bruit and no mass. There is no tenderness.  Genitourinary: No breast tenderness, discharge or bleeding.  Musculoskeletal: Normal range of motion. She exhibits no edema or tenderness.  Lymphadenopathy:    She has no cervical adenopathy.  Neurological: She is alert. She has normal reflexes. She displays normal reflexes. No cranial nerve deficit. She exhibits normal muscle tone. Coordination normal.  Skin: Skin is warm and dry. No rash noted. No erythema. No pallor.  Solar lentigines diffusely    Psychiatric: She has a normal mood and affect.  Pleasant           Assessment & Plan:   Problem  List Items Addressed This Visit      Other   Lump in the abdomen    No clinical changes  Rev CT report from 2016      Routine general medical examination at a health care facility - Primary    Reviewed health habits including diet and exercise and skin cancer prevention Reviewed appropriate screening tests for age  Also reviewed health mt list, fam hx and immunization status , as well as social and family history   See HPI Labs reviewed  Planing f/u with gyn and dermatology  Nl breast exam today  Lump in L abd is clinically unchanged  Flu shot today  Enc self care incl finding time for exercise  Labs are re assuring        Other Visit Diagnoses    Need for influenza vaccination       Relevant Orders   Flu Vaccine QUAD 6+ mos PF IM (Fluarix Quad PF) (Completed)

## 2018-03-09 NOTE — Patient Instructions (Addendum)
Don't forget your gyn follow up   Also re schedule your dermatology visit  Use sunscreen when needed   Try rolling your right foot over a frozen water bottle several times daily  Avoid going barefoot   Exercise when you can   Flu shot today   Labs look good

## 2018-03-26 ENCOUNTER — Telehealth: Payer: Self-pay | Admitting: Family Medicine

## 2018-03-26 NOTE — Telephone Encounter (Signed)
Patient dropped off a form for health insurance to be completed. Placed in [place] tower. Please advise and call when it is ready for pick up.

## 2018-03-27 NOTE — Telephone Encounter (Signed)
Left VM letting pt know form ready for pick up 

## 2018-03-27 NOTE — Telephone Encounter (Signed)
Done and in IN box 

## 2018-03-27 NOTE — Telephone Encounter (Signed)
In your inbox.

## 2018-04-15 ENCOUNTER — Other Ambulatory Visit: Payer: BLUE CROSS/BLUE SHIELD

## 2018-04-20 ENCOUNTER — Encounter: Payer: BLUE CROSS/BLUE SHIELD | Admitting: Family Medicine

## 2018-12-17 ENCOUNTER — Other Ambulatory Visit: Payer: Self-pay | Admitting: Family Medicine

## 2018-12-17 DIAGNOSIS — Z1231 Encounter for screening mammogram for malignant neoplasm of breast: Secondary | ICD-10-CM

## 2018-12-22 ENCOUNTER — Ambulatory Visit
Admission: RE | Admit: 2018-12-22 | Discharge: 2018-12-22 | Disposition: A | Discharge status: Home / Self Care | Payer: BC Managed Care – PPO | Source: Ambulatory Visit | Attending: Family Medicine | Admitting: Family Medicine

## 2018-12-22 ENCOUNTER — Other Ambulatory Visit: Payer: Self-pay

## 2018-12-22 DIAGNOSIS — Z1231 Encounter for screening mammogram for malignant neoplasm of breast: Secondary | ICD-10-CM | POA: Diagnosis not present

## 2018-12-23 ENCOUNTER — Other Ambulatory Visit: Payer: Self-pay | Admitting: Family Medicine

## 2018-12-23 DIAGNOSIS — N6489 Other specified disorders of breast: Secondary | ICD-10-CM

## 2018-12-23 DIAGNOSIS — R928 Other abnormal and inconclusive findings on diagnostic imaging of breast: Secondary | ICD-10-CM

## 2019-01-06 ENCOUNTER — Ambulatory Visit
Admission: RE | Admit: 2019-01-06 | Discharge: 2019-01-06 | Disposition: A | Discharge status: Home / Self Care | Payer: BC Managed Care – PPO | Source: Ambulatory Visit | Attending: Family Medicine | Admitting: Family Medicine

## 2019-01-06 ENCOUNTER — Other Ambulatory Visit: Payer: Self-pay

## 2019-01-06 DIAGNOSIS — R928 Other abnormal and inconclusive findings on diagnostic imaging of breast: Secondary | ICD-10-CM | POA: Insufficient documentation

## 2019-01-06 DIAGNOSIS — R922 Inconclusive mammogram: Secondary | ICD-10-CM | POA: Diagnosis not present

## 2019-01-06 DIAGNOSIS — N6489 Other specified disorders of breast: Secondary | ICD-10-CM | POA: Diagnosis not present

## 2019-01-06 DIAGNOSIS — N6012 Diffuse cystic mastopathy of left breast: Secondary | ICD-10-CM | POA: Diagnosis not present

## 2019-06-24 DIAGNOSIS — Z1231 Encounter for screening mammogram for malignant neoplasm of breast: Secondary | ICD-10-CM | POA: Diagnosis not present

## 2019-06-24 DIAGNOSIS — Z13220 Encounter for screening for lipoid disorders: Secondary | ICD-10-CM | POA: Diagnosis not present

## 2019-06-24 DIAGNOSIS — Z0000 Encounter for general adult medical examination without abnormal findings: Secondary | ICD-10-CM | POA: Diagnosis not present

## 2019-06-24 DIAGNOSIS — Z131 Encounter for screening for diabetes mellitus: Secondary | ICD-10-CM | POA: Diagnosis not present

## 2019-06-24 DIAGNOSIS — Z01419 Encounter for gynecological examination (general) (routine) without abnormal findings: Secondary | ICD-10-CM | POA: Diagnosis not present

## 2019-06-24 DIAGNOSIS — Z124 Encounter for screening for malignant neoplasm of cervix: Secondary | ICD-10-CM | POA: Diagnosis not present

## 2019-06-28 LAB — HM PAP SMEAR

## 2019-11-15 ENCOUNTER — Ambulatory Visit (INDEPENDENT_AMBULATORY_CARE_PROVIDER_SITE_OTHER): Payer: BC Managed Care – PPO | Admitting: Dermatology

## 2019-11-15 ENCOUNTER — Other Ambulatory Visit: Payer: Self-pay

## 2019-11-15 ENCOUNTER — Encounter: Payer: Self-pay | Admitting: Dermatology

## 2019-11-15 DIAGNOSIS — L578 Other skin changes due to chronic exposure to nonionizing radiation: Secondary | ICD-10-CM

## 2019-11-15 DIAGNOSIS — D229 Melanocytic nevi, unspecified: Secondary | ICD-10-CM

## 2019-11-15 DIAGNOSIS — L820 Inflamed seborrheic keratosis: Secondary | ICD-10-CM | POA: Diagnosis not present

## 2019-11-15 DIAGNOSIS — Z86018 Personal history of other benign neoplasm: Secondary | ICD-10-CM

## 2019-11-15 DIAGNOSIS — Z85820 Personal history of malignant melanoma of skin: Secondary | ICD-10-CM

## 2019-11-15 DIAGNOSIS — D485 Neoplasm of uncertain behavior of skin: Secondary | ICD-10-CM

## 2019-11-15 DIAGNOSIS — D2272 Melanocytic nevi of left lower limb, including hip: Secondary | ICD-10-CM

## 2019-11-15 DIAGNOSIS — L814 Other melanin hyperpigmentation: Secondary | ICD-10-CM

## 2019-11-15 DIAGNOSIS — D1800 Hemangioma unspecified site: Secondary | ICD-10-CM

## 2019-11-15 DIAGNOSIS — I781 Nevus, non-neoplastic: Secondary | ICD-10-CM

## 2019-11-15 DIAGNOSIS — D225 Melanocytic nevi of trunk: Secondary | ICD-10-CM

## 2019-11-15 DIAGNOSIS — D2271 Melanocytic nevi of right lower limb, including hip: Secondary | ICD-10-CM

## 2019-11-15 DIAGNOSIS — L821 Other seborrheic keratosis: Secondary | ICD-10-CM

## 2019-11-15 DIAGNOSIS — Z1283 Encounter for screening for malignant neoplasm of skin: Secondary | ICD-10-CM | POA: Diagnosis not present

## 2019-11-15 DIAGNOSIS — D492 Neoplasm of unspecified behavior of bone, soft tissue, and skin: Secondary | ICD-10-CM

## 2019-11-15 NOTE — Progress Notes (Signed)
Follow-Up Visit   Subjective  Rebecca Peters is a 44 y.o. female who presents for the following: Annual Exam (total body skin exam, hx of melaoma R lower leg 2011, hx of dysplastic nevi).  She has spot between breasts that get sore and irritated, especially when she is outside and sweating.  No other moles changing.  She also has a pink spot on her forehead she would like removed if possible.   The following portions of the chart were reviewed this encounter and updated as appropriate:      Review of Systems:  No other skin or systemic complaints except as noted in HPI or Assessment and Plan.  Objective  Well appearing patient in no apparent distress; mood and affect are within normal limits.  A full examination was performed including scalp, head, eyes, ears, nose, lips, neck, chest, axillae, abdomen, back, buttocks, bilateral upper extremities, bilateral lower extremities, hands, feet, fingers, toes, fingernails, and toenails. All findings within normal limits unless otherwise noted below.  Objective  R med lower leg: Well healed scar with no evidence of recurrence  Objective    L spinal lower back: 2.83mm med dark brown macule  Right Lower Leg inferior to scar: 2.98mm med dark brown macule  L med calf: 2.18mm med dark brown macule  Right lateral lower leg above ankle: 2.31mm med brown macule  Images        Objective  multiple: Scars with no evidence of recurrence.   Objective  L medial breast: 3.59mm med brown pap     Objective  face: Blanching pink macule c/w telangiectasia above R eyebrow   Assessment & Plan    Lentigines - Scattered tan macules face, trunk, ext. - Discussed due to sun exposure - Benign, observe, discussed BBL for face/chest, multiple tx needed for best result, $350/tx for face, $500/tx face/chest - Call for any changes  Seborrheic Keratoses - Stuck-on, waxy, tan-brown papules and plaques  - Discussed benign etiology and  prognosis. - Observe - Call for any changes  Melanocytic Nevi - Tan-brown and/or pink-flesh-colored symmetric macules and papules - Benign appearing on exam today - Observation - Call clinic for new or changing moles - Recommend daily use of broad spectrum spf 30+ sunscreen to sun-exposed areas.   Hemangiomas - Red papules - Discussed benign nature - Observe - Call for any changes  Actinic Damage - diffuse scaly erythematous macules with underlying dyspigmentation - Recommend daily broad spectrum sunscreen SPF 30+ to sun-exposed areas, reapply every 2 hours as needed.  - Call for new or changing lesions. - Discussed BBL  Skin cancer screening performed today.   History of melanoma R med lower leg  Clear. Observe for recurrence. Call clinic for new or changing lesions.  Recommend regular skin exams, daily broad-spectrum spf 30+ sunscreen use, and photoprotection.     Nevus (4) Right Lower Leg inferior to scar; Right lateral lower leg above ankle; L med calf; L spinal lower back  History of dysplastic nevus multiple  Clear. Observe for recurrence. Call clinic for new or changing lesions.  Recommend regular skin exams, daily broad-spectrum spf 30+ sunscreen use, and photoprotection.     Neoplasm of skin L medial breast  Skin / nail biopsy Type of biopsy: tangential   Informed consent: discussed and consent obtained   Patient was prepped and draped in usual sterile fashion: Area prepped with alcohol. Anesthesia: the lesion was anesthetized in a standard fashion   Anesthetic:  1% lidocaine w/ epinephrine  1-100,000 buffered w/ 8.4% NaHCO3 Instrument used: flexible razor blade   Hemostasis achieved with: pressure, aluminum chloride and electrodesiccation   Outcome: patient tolerated procedure well   Post-procedure details: wound care instructions given   Post-procedure details comment:  Ointment and small bandage applied  Specimen 1 - Surgical pathology Differential  Diagnosis: D48.5 Irritated Nevus vs SK Check Margins: No 3.70mm med brown pap  Telangiectasia face  Spider Angioma.  Discussed BBL. Multiple treatments may be required to clear. $200/tx for single lesion, $350/tx for full face. Patient will schedule in fall with Lind Covert.  Return in about 1 year (around 11/14/2020) for TBSE, h/o melanoma 2011.  I, Othelia Pulling, RMA, am acting as scribe for Brendolyn Patty, MD .  Documentation: I have reviewed the above documentation for accuracy and completeness, and I agree with the above.  Brendolyn Patty MD

## 2019-11-15 NOTE — Patient Instructions (Signed)
Wound Care Instructions  1. Cleanse wound gently with soap and water once a day then pat dry with clean gauze. Apply a thing coat of Petrolatum (petroleum jelly, "Vaseline") over the wound (unless you have an allergy to this). We recommend that you use a new, sterile tube of Vaseline. Do not pick or remove scabs. Do not remove the yellow or white "healing tissue" from the base of the wound.  2. Cover the wound with fresh, clean, nonstick gauze and secure with paper tape. You may use Band-Aids in place of gauze and tape if the would is small enough, but would recommend trimming much of the tape off as there is often too much. Sometimes Band-Aids can irritate the skin.  3. You should call the office for your biopsy report after 1 week if you have not already been contacted.  4. If you experience any problems, such as abnormal amounts of bleeding, swelling, significant bruising, significant pain, or evidence of infection, please call the office immediately.  5. FOR ADULT SURGERY PATIENTS: If you need something for pain relief you may take 1 extra strength Tylenol (acetaminophen) AND 2 Ibuprofen (200mg each) together every 4 hours as needed for pain. (do not take these if you are allergic to them or if you have a reason you should not take them.) Typically, you may only need pain medication for 1 to 3 days.   Melanoma ABCDEs  Melanoma is the most dangerous type of skin cancer, and is the leading cause of death from skin disease.  You are more likely to develop melanoma if you:  Have light-colored skin, light-colored eyes, or red or blond hair  Spend a lot of time in the sun  Tan regularly, either outdoors or in a tanning bed  Have had blistering sunburns, especially during childhood  Have a close family member who has had a melanoma  Have atypical moles or large birthmarks  Early detection of melanoma is key since treatment is typically straightforward and cure rates are extremely high if we  catch it early.   The first sign of melanoma is often a change in a mole or a new dark spot.  The ABCDE system is a way of remembering the signs of melanoma.  A for asymmetry:  The two halves do not match. B for border:  The edges of the growth are irregular. C for color:  A mixture of colors are present instead of an even brown color. D for diameter:  Melanomas are usually (but not always) greater than 6mm - the size of a pencil eraser. E for evolution:  The spot keeps changing in size, shape, and color.  Please check your skin once per month between visits. You can use a small mirror in front and a large mirror behind you to keep an eye on the back side or your body.   If you see any new or changing lesions before your next follow-up, please call to schedule a visit.  Please continue daily skin protection including broad spectrum sunscreen SPF 30+ to sun-exposed areas, reapplying every 2 hours as needed when you're outdoors.     

## 2019-11-17 ENCOUNTER — Telehealth: Payer: Self-pay

## 2019-11-17 NOTE — Telephone Encounter (Signed)
Advised pt of bx results/sh ?

## 2019-11-17 NOTE — Telephone Encounter (Signed)
-----   Message from Brendolyn Patty, MD sent at 11/16/2019  7:08 PM EDT ----- Skin , left medial breast PIGMENTED SEBORRHEIC KERATOSIS, INFLAMED  benign

## 2020-03-27 ENCOUNTER — Other Ambulatory Visit: Payer: Self-pay | Admitting: Family Medicine

## 2020-03-27 ENCOUNTER — Other Ambulatory Visit: Payer: Self-pay | Admitting: Obstetrics & Gynecology

## 2020-03-27 DIAGNOSIS — Z1231 Encounter for screening mammogram for malignant neoplasm of breast: Secondary | ICD-10-CM

## 2020-05-03 ENCOUNTER — Other Ambulatory Visit: Payer: Self-pay

## 2020-05-03 ENCOUNTER — Ambulatory Visit
Admission: RE | Admit: 2020-05-03 | Discharge: 2020-05-03 | Disposition: A | Discharge status: Home / Self Care | Payer: BC Managed Care – PPO | Source: Ambulatory Visit | Attending: Obstetrics & Gynecology | Admitting: Obstetrics & Gynecology

## 2020-05-03 DIAGNOSIS — Z1231 Encounter for screening mammogram for malignant neoplasm of breast: Secondary | ICD-10-CM | POA: Diagnosis not present

## 2020-05-29 DIAGNOSIS — Z1152 Encounter for screening for COVID-19: Secondary | ICD-10-CM | POA: Diagnosis not present

## 2020-05-31 DIAGNOSIS — Z20822 Contact with and (suspected) exposure to covid-19: Secondary | ICD-10-CM | POA: Diagnosis not present

## 2020-05-31 DIAGNOSIS — Z03818 Encounter for observation for suspected exposure to other biological agents ruled out: Secondary | ICD-10-CM | POA: Diagnosis not present

## 2020-06-03 DIAGNOSIS — Z20822 Contact with and (suspected) exposure to covid-19: Secondary | ICD-10-CM | POA: Diagnosis not present

## 2020-06-03 DIAGNOSIS — Z03818 Encounter for observation for suspected exposure to other biological agents ruled out: Secondary | ICD-10-CM | POA: Diagnosis not present

## 2020-08-12 ENCOUNTER — Other Ambulatory Visit: Payer: Self-pay

## 2020-08-12 DIAGNOSIS — Z79899 Other long term (current) drug therapy: Secondary | ICD-10-CM | POA: Insufficient documentation

## 2020-08-12 DIAGNOSIS — F12929 Cannabis use, unspecified with intoxication, unspecified: Secondary | ICD-10-CM | POA: Insufficient documentation

## 2020-08-12 DIAGNOSIS — N390 Urinary tract infection, site not specified: Secondary | ICD-10-CM | POA: Diagnosis not present

## 2020-08-12 DIAGNOSIS — R1110 Vomiting, unspecified: Secondary | ICD-10-CM | POA: Diagnosis not present

## 2020-08-12 DIAGNOSIS — R002 Palpitations: Secondary | ICD-10-CM | POA: Diagnosis not present

## 2020-08-12 DIAGNOSIS — R079 Chest pain, unspecified: Secondary | ICD-10-CM | POA: Diagnosis not present

## 2020-08-12 DIAGNOSIS — R000 Tachycardia, unspecified: Secondary | ICD-10-CM | POA: Diagnosis not present

## 2020-08-12 DIAGNOSIS — E876 Hypokalemia: Secondary | ICD-10-CM | POA: Diagnosis not present

## 2020-08-13 ENCOUNTER — Emergency Department (HOSPITAL_COMMUNITY): Payer: BC Managed Care – PPO

## 2020-08-13 ENCOUNTER — Emergency Department (HOSPITAL_COMMUNITY)
Admission: EM | Admit: 2020-08-13 | Discharge: 2020-08-13 | Disposition: A | Discharge status: Home / Self Care | Payer: BC Managed Care – PPO | Attending: Emergency Medicine | Admitting: Emergency Medicine

## 2020-08-13 ENCOUNTER — Encounter (HOSPITAL_COMMUNITY): Payer: Self-pay | Admitting: Emergency Medicine

## 2020-08-13 DIAGNOSIS — F12929 Cannabis use, unspecified with intoxication, unspecified: Secondary | ICD-10-CM

## 2020-08-13 DIAGNOSIS — R002 Palpitations: Secondary | ICD-10-CM | POA: Diagnosis not present

## 2020-08-13 DIAGNOSIS — R079 Chest pain, unspecified: Secondary | ICD-10-CM | POA: Diagnosis not present

## 2020-08-13 DIAGNOSIS — E876 Hypokalemia: Secondary | ICD-10-CM

## 2020-08-13 DIAGNOSIS — N390 Urinary tract infection, site not specified: Secondary | ICD-10-CM

## 2020-08-13 LAB — URINALYSIS, ROUTINE W REFLEX MICROSCOPIC
Bilirubin Urine: NEGATIVE
Glucose, UA: NEGATIVE mg/dL
Ketones, ur: NEGATIVE mg/dL
Leukocytes,Ua: NEGATIVE
Nitrite: POSITIVE — AB
Protein, ur: NEGATIVE mg/dL
Specific Gravity, Urine: 1.013 (ref 1.005–1.030)
pH: 6 (ref 5.0–8.0)

## 2020-08-13 LAB — COMPREHENSIVE METABOLIC PANEL
ALT: 13 U/L (ref 0–44)
AST: 21 U/L (ref 15–41)
Albumin: 4.4 g/dL (ref 3.5–5.0)
Alkaline Phosphatase: 67 U/L (ref 38–126)
Anion gap: 14 (ref 5–15)
BUN: 16 mg/dL (ref 6–20)
CO2: 19 mmol/L — ABNORMAL LOW (ref 22–32)
Calcium: 8.8 mg/dL — ABNORMAL LOW (ref 8.9–10.3)
Chloride: 104 mmol/L (ref 98–111)
Creatinine, Ser: 0.95 mg/dL (ref 0.44–1.00)
GFR, Estimated: >60 mL/min (ref >60)
Glucose, Bld: 158 mg/dL — ABNORMAL HIGH (ref 70–99)
Potassium: 3.1 mmol/L — ABNORMAL LOW (ref 3.5–5.1)
Sodium: 137 mmol/L (ref 135–145)
Total Bilirubin: 0.5 mg/dL (ref 0.3–1.2)
Total Protein: 7.9 g/dL (ref 6.5–8.1)

## 2020-08-13 LAB — RAPID URINE DRUG SCREEN, HOSP PERFORMED
Amphetamines: NOT DETECTED
Barbiturates: NOT DETECTED
Benzodiazepines: NOT DETECTED
Cocaine: NOT DETECTED
Opiates: NOT DETECTED
Tetrahydrocannabinol: POSITIVE — AB

## 2020-08-13 LAB — I-STAT BETA HCG BLOOD, ED (MC, WL, AP ONLY): I-stat hCG, quantitative: <5 m[IU]/mL (ref <5)

## 2020-08-13 LAB — CBC
HCT: 40.8 % (ref 36.0–46.0)
Hemoglobin: 13.4 g/dL (ref 12.0–15.0)
MCH: 31.2 pg (ref 26.0–34.0)
MCHC: 32.8 g/dL (ref 30.0–36.0)
MCV: 95.1 fL (ref 80.0–100.0)
Platelets: 262 10*3/uL (ref 150–400)
RBC: 4.29 MIL/uL (ref 3.87–5.11)
RDW: 12.1 % (ref 11.5–15.5)
WBC: 13.2 10*3/uL — ABNORMAL HIGH (ref 4.0–10.5)
nRBC: 0 % (ref 0.0–0.2)

## 2020-08-13 LAB — LIPASE, BLOOD: Lipase: 31 U/L (ref 11–51)

## 2020-08-13 LAB — ETHANOL: Alcohol, Ethyl (B): <10 mg/dL (ref <10)

## 2020-08-13 LAB — TROPONIN I (HIGH SENSITIVITY): Troponin I (High Sensitivity): 3 ng/L (ref <18)

## 2020-08-13 MED ORDER — CEPHALEXIN 500 MG PO CAPS: ORAL_CAPSULE | ORAL | 0 refills | Status: DC

## 2020-08-13 MED ORDER — POTASSIUM CHLORIDE CRYS ER 20 MEQ PO TBCR
40.0000 meq | EXTENDED_RELEASE_TABLET | Freq: Once | ORAL | Status: AC
  Administered 2020-08-13: 40 meq via ORAL
  Filled 2020-08-13: qty 2

## 2020-08-13 MED ORDER — ONDANSETRON HCL 4 MG/2ML IJ SOLN
4.0000 mg | Freq: Once | INTRAMUSCULAR | Status: AC
  Administered 2020-08-13: 4 mg via INTRAVENOUS
  Filled 2020-08-13: qty 2

## 2020-08-13 MED ORDER — CEPHALEXIN 500 MG PO CAPS
500.0000 mg | ORAL_CAPSULE | Freq: Once | ORAL | Status: AC
  Administered 2020-08-13: 500 mg via ORAL
  Filled 2020-08-13: qty 1

## 2020-08-13 NOTE — ED Triage Notes (Signed)
Pt states that her "whole nervous system is acting up." states that her heart feels like it is beating very fast and she started vomiting around 10p.

## 2020-08-13 NOTE — ED Provider Notes (Signed)
[facility] DEPT Provider Note   CSN: [id number] Arrival date & time: 08/12/20  2328     History Chief Complaint  Patient presents with  . Vomiting  . Palpitations    Rebecca Peters is a 45 y.o. female presents to the Emergency Department complaining of gradual, persistent, progressively worsening palpitations and vomiting.  Patient describes her symptoms as "whole nervous system is acting up."  Patient reports this started around 10 PM.  She states that around 8 PM she ate a marijuana gummy.  Unknown strain or strength.  Patient reports she has never had a marijuana gummy before.  She states that she was having sexual intercourse when the symptoms began.  She reports she still feels very nervous, anxious and shaky.  She reports she does feel like her heart continues to be fast.  She reports emesis is nonbloody and nonbilious.  She has no pain.  No vision changes.  No numbness, tingling or weakness.  Patient denies commendation with alcohol tonight.  No specific aggravating or alleviating factors.  No treatments prior to arrival.   The history is provided by the patient and medical records. No language interpreter was used.       Past Medical History:  Diagnosis Date  . Cancer ([place])    melonoma  . Dysplasia of cervix, unspecified   . Dysplastic nevus 05/10/2010   Left mid back. Mild atypia. Close to margin.  Marland Kitchen Dysplastic nevus 02/22/2014   Right central upper abdomen. Mild atypia. Limited margins free.  Marland Kitchen Dysplastic nevus 02/22/2014   Left flank. Moderate atypia. Lateral margin involved.  Marland Kitchen Dysplastic nevus 01/16/2015   Right spinal upper back. Mild atypia. Limited margins free.   Marland Kitchen Dysplastic nevus 02/04/2017   Right medial breast. Mild atypia. Deep margin focally involved.   Marland Kitchen Dysplastic nevus 02/04/2017   Left upper back. Mild atypia. Limited margins free.   . Late menses    long cycle---menses every 6 weeks  . Melanoma ([place]) 02/08/2010    Right lower leg. Compound melanocytic proliferation with severe atypia, best interpreted as Melanoma. Clarkes level: IV. Breslow's 1.12mm, excised at [facility]  . Personal history of other malignant neoplasm of skin     Patient Active Problem List   Diagnosis Date Noted  . Fibrocystic disease of both breasts 06/20/2014  . Lump in the abdomen 06/20/2014  . Routine general medical examination at a health care facility 03/24/2011  . Screening for lipoid disorders 03/22/2011  . Diabetes mellitus screening 03/22/2011  . H/O abnormal cervical Papanicolaou smear 05/17/2010  . COCCYGEAL PAIN 05/15/2010  . SKIN CANCER, HX OF 05/15/2010    Past Surgical History:  Procedure Laterality Date  . APPENDECTOMY  5/10  . COLPOSCOPY  12/11  . MELANOMA EXCISION  10/11   lymphnodes stage 2; wide excision with 2 LN removed     OB History   No obstetric history on file.     Family History  Problem Relation Age of Onset  . Hyperlipidemia Father   . Hypertension Father   . Hyperlipidemia Mother   . Breast cancer Maternal Grandmother   . Cancer Neg Hx     Social History   Tobacco Use  . Smoking status: Never Smoker  . Smokeless tobacco: Never Used  Substance Use Topics  . Alcohol use: Yes    Alcohol/week: 0.0 standard drinks    Comment: 1 drink every few months  . Drug use: No    Home Medications Prior to Admission  medications   Medication Sig Start Date End Date Taking? Authorizing Provider  cephALEXin (KEFLEX) 500 MG capsule 1 cap po bid x 7 days 08/13/20  Yes Muthersbaugh, Jarrett Soho, PA-C  ergocalciferol (VITAMIN D2) 1.25 MG (50000 UT) capsule Take 1 capsule by mouth once a week. 06/28/19  Yes [provider]    Allergies    Metoclopramide hcl  Review of Systems   Review of Systems  Constitutional: Negative for appetite change, diaphoresis, fatigue, fever and unexpected weight change.  HENT: Negative for mouth sores.   Eyes: Negative for visual disturbance.  Respiratory:  Negative for cough, chest tightness, shortness of breath and wheezing.   Cardiovascular: Positive for palpitations. Negative for chest pain.  Gastrointestinal: Positive for nausea and vomiting. Negative for abdominal pain, constipation and diarrhea.  Endocrine: Negative for polydipsia, polyphagia and polyuria.  Genitourinary: Negative for dysuria, frequency, hematuria and urgency.  Musculoskeletal: Negative for back pain and neck stiffness.  Skin: Negative for rash.  Allergic/Immunologic: Negative for immunocompromised state.  Neurological: Negative for syncope, light-headedness and headaches.  Hematological: Does not bruise/bleed easily.  Psychiatric/Behavioral: Negative for sleep disturbance. The patient is not nervous/anxious.     Physical Exam Updated Vital Signs BP (!) 135/91 (BP Location: Right Arm)   Pulse (!) 116   Temp 97.9 F (36.6 C) (Oral)   Resp 18   SpO2 100%   Physical Exam Vitals and nursing note reviewed.  Constitutional:      General: She is not in acute distress.    Appearance: She is not diaphoretic.  HENT:     Head: Normocephalic.  Eyes:     General: No scleral icterus.    Conjunctiva/sclera: Conjunctivae normal.     Pupils: Pupils are equal, round, and reactive to light.     Comments: Pupils dilated but equal round and reactive.  Cardiovascular:     Rate and Rhythm: Regular rhythm. Tachycardia present.     Pulses: Normal pulses.          Radial pulses are 2+ on the right side and 2+ on the left side.  Pulmonary:     Effort: No tachypnea, accessory muscle usage, prolonged expiration, respiratory distress or retractions.     Breath sounds: Normal breath sounds. No stridor.     Comments: Equal chest rise. No increased work of breathing. Abdominal:     General: Bowel sounds are normal. There is no distension.     Palpations: Abdomen is soft.     Tenderness: There is no abdominal tenderness. There is no guarding or rebound.  Musculoskeletal:      Cervical back: Normal range of motion.     Comments: Moves all extremities equally and without difficulty.  Skin:    General: Skin is warm and dry.     Capillary Refill: Capillary refill takes less than 2 seconds.  Neurological:     Mental Status: She is alert.     GCS: GCS eye subscore is 4. GCS verbal subscore is 5. GCS motor subscore is 6.     Comments: Speech is clear and goal oriented.  Psychiatric:        Mood and Affect: Mood is anxious.     ED Results / Procedures / Treatments   Labs (all labs ordered are listed, but only abnormal results are displayed) Labs Reviewed  CBC - Abnormal; Notable for the following components:      Result Value   WBC 13.2 (*)    All other components within normal limits  COMPREHENSIVE METABOLIC PANEL - Abnormal; Notable for the following components:   Potassium 3.1 (*)    CO2 19 (*)    Glucose, Bld 158 (*)    Calcium 8.8 (*)    All other components within normal limits  RAPID URINE DRUG SCREEN, HOSP PERFORMED - Abnormal; Notable for the following components:   Tetrahydrocannabinol POSITIVE (*)    All other components within normal limits  URINALYSIS, ROUTINE W REFLEX MICROSCOPIC - Abnormal; Notable for the following components:   Hgb urine dipstick SMALL (*)    Nitrite POSITIVE (*)    Bacteria, UA FEW (*)    All other components within normal limits  URINE CULTURE  LIPASE, BLOOD  ETHANOL  I-STAT BETA HCG BLOOD, ED ([facility], [facility], [facility] ONLY)  TROPONIN I (HIGH SENSITIVITY)  TROPONIN I (HIGH SENSITIVITY)    EKG EKG Interpretation  Date/Time:  Sunday August 13 2020 00:01:33 EDT Ventricular Rate:  103 PR Interval:    QRS Duration: 101 QT Interval:  372 QTC Calculation: 487 R Axis:   32 Text Interpretation: Sinus tachycardia Atrial premature complex RSR' in V1 or V2, probably normal variant Borderline prolonged QT interval No previous ECGs available Confirmed by Ripley Fraise [phone]) on 08/13/2020 12:11:37 AM   Radiology DG Chest 2  View  Result Date: 08/13/2020 CLINICAL DATA:  Chest pain and palpitations. EXAM: CHEST - 2 VIEW COMPARISON:  None. FINDINGS: The heart size and mediastinal contours are within normal limits. No focal consolidation. No pleural effusion. No pneumothorax. The visualized skeletal structures are unremarkable. IMPRESSION: No acute cardiopulmonary disease. Electronically Signed   By: Dahlia Bailiff MD   On: 08/13/2020 00:30    Procedures Procedures   Medications Ordered in ED Medications  cephALEXin (KEFLEX) capsule 500 mg (has no administration in time range)  ondansetron (ZOFRAN) injection 4 mg (4 mg Intravenous Given 08/13/20 0123)  potassium chloride SA (KLOR-CON) CR tablet 40 mEq (40 mEq Oral Given 08/13/20 0321)    ED Course  I have reviewed the triage vital signs and the nursing notes.  Pertinent labs & imaging results that were available during my care of the patient were reviewed by me and considered in my medical decision making (see chart for details).  Clinical Course as of 08/13/20 0524  Sun Aug 13, 2020  0243 Significantly improved. [HM]  7510 WBC(!): 13.2 Noted but in the absence of fever likely reactive. [HM]  0243 Potassium(!): 3.1 Noted.  Will give Klor-Con. [HM]    Clinical Course User Index [HM] Muthersbaugh, Gwenlyn Perking   MDM Rules/Calculators/A&P                           Patient presents the emergency department with nausea, vomiting, anxiety and palpitations after ingestion of marijuana.  Exam reassuring.  Will give fluids, check basic labs and reassess.  5:21 AM Work-up is overall reassuring.  Rapid drug screen positive only for marijuana.  Mild hypokalemia needed.  Patient given Klor-Con here in the emergency department.  Urinalysis with evidence of urinary tract infection.  Will give Keflex.  Urine culture sent.  Patient is feeling much better.  She is been able to eat and drink without difficulty.  No additional vomiting.  Tachycardia has resolved.  Patient  reports that she is no longer dizzy and only feels tired at this time.  BP 121/81   Pulse 99   Temp 97.9 F (36.6 C) (Oral)   Resp 17   SpO2 97%  Final Clinical Impression(s) / ED Diagnoses Final diagnoses:  Marijuana intoxication, with unspecified complication ([place])  Hypokalemia  Urinary tract infection without hematuria, site unspecified    Rx / DC Orders ED Discharge Orders         Ordered    cephALEXin (KEFLEX) 500 MG capsule        08/13/20 0523           Muthersbaugh, Jarrett Soho, PA-C 08/13/20 0524    Ripley Fraise, MD 08/13/20 [phone]

## 2020-08-13 NOTE — ED Notes (Signed)
Pt tolerated fluids with no nausea. Upon sitting up, HR rose to ~115, slowly decreasing.

## 2020-08-13 NOTE — Discharge Instructions (Addendum)
1. Medications: usual home medications 2. Treatment: rest, drink plenty of fluids,  3. Follow Up: Please followup with your primary doctor in 3-5 days for discussion of your diagnoses and further evaluation after today's visit; if you do not have a primary care doctor use the resource guide provided to find one; Please return to the ER for persistent symptoms, continuing palpitations or other concerns.

## 2020-08-14 LAB — URINE CULTURE

## 2020-10-08 ENCOUNTER — Telehealth: Payer: BC Managed Care – PPO | Admitting: Family

## 2020-10-08 DIAGNOSIS — R21 Rash and other nonspecific skin eruption: Secondary | ICD-10-CM | POA: Diagnosis not present

## 2020-10-08 MED ORDER — TRIAMCINOLONE ACETONIDE 0.5 % EX OINT
1.0000 | TOPICAL_OINTMENT | Freq: Two times a day (BID) | CUTANEOUS | 0 refills | Status: DC
Start: 2020-10-08 — End: 2021-03-12

## 2020-10-08 NOTE — Progress Notes (Signed)
E Visit for Rash  We are sorry that you are not feeling well. Here is how we plan to help!  Based on what you shared with me it looks like you have contact dermatitis.  Contact dermatitis is a skin rash caused by something that touches the skin and causes irritation or inflammation.  Your skin may be red, swollen, dry, cracked, and itch.  The rash should go away in a few days but can last a few weeks.  If you get a rash, it's important to figure out what caused it so the irritant can be avoided in the future.  Kenalog cream twice a day for 7 days.    HOME CARE:   Take cool showers and avoid direct sunlight.  Apply cool compress or wet dressings.  Take a bath in an oatmeal bath.  Sprinkle content of one Aveeno packet under running faucet with comfortably warm water.  Bathe for 15-20 minutes, 1-2 times daily.  Pat dry with a towel. Do not rub the rash.  Use hydrocortisone cream.  Take an antihistamine like Benadryl for widespread rashes that itch.  The adult dose of Benadryl is 25-50 mg by mouth 4 times daily.  Caution:  This type of medication may cause sleepiness.  Do not drink alcohol, drive, or operate dangerous machinery while taking antihistamines.  Do not take these medications if you have prostate enlargement.  Read package instructions thoroughly on all medications that you take.  GET HELP RIGHT AWAY IF:   Symptoms don't go away after treatment.  Severe itching that persists.  If you rash spreads or swells.  If you rash begins to smell.  If it blisters and opens or develops a yellow-brown crust.  You develop a fever.  You have a sore throat.  You become short of breath.  MAKE SURE YOU:  Understand these instructions. Will watch your condition. Will get help right away if you are not doing well or get worse.  Thank you for choosing an e-visit. Your e-visit answers were reviewed by a board certified advanced clinical practitioner to complete your personal care  plan. Depending upon the condition, your plan could have included both over the counter or prescription medications. Please review your pharmacy choice. Be sure that the pharmacy you have chosen is open so that you can pick up your prescription now.  If there is a problem you may message your provider in [place] to have the prescription routed to another pharmacy. Your safety is important to [name]. If you have drug allergies check your prescription carefully.  For the next 24 hours, you can use MyChart to ask questions about today's visit, request a non-urgent call back, or ask for a work or school excuse from your e-visit provider. You will get an email in the next two days asking about your experience. I hope that your e-visit has been valuable and will speed your recovery.     Approximately 5 minutes was spent documenting and reviewing patient's chart.

## 2020-10-30 DIAGNOSIS — M9903 Segmental and somatic dysfunction of lumbar region: Secondary | ICD-10-CM | POA: Diagnosis not present

## 2020-10-30 DIAGNOSIS — M9905 Segmental and somatic dysfunction of pelvic region: Secondary | ICD-10-CM | POA: Diagnosis not present

## 2020-10-30 DIAGNOSIS — M7541 Impingement syndrome of right shoulder: Secondary | ICD-10-CM | POA: Diagnosis not present

## 2020-10-30 DIAGNOSIS — M9902 Segmental and somatic dysfunction of thoracic region: Secondary | ICD-10-CM | POA: Diagnosis not present

## 2020-10-30 DIAGNOSIS — M9901 Segmental and somatic dysfunction of cervical region: Secondary | ICD-10-CM | POA: Diagnosis not present

## 2020-11-01 DIAGNOSIS — M7541 Impingement syndrome of right shoulder: Secondary | ICD-10-CM | POA: Diagnosis not present

## 2020-11-01 DIAGNOSIS — M9902 Segmental and somatic dysfunction of thoracic region: Secondary | ICD-10-CM | POA: Diagnosis not present

## 2020-11-01 DIAGNOSIS — M9901 Segmental and somatic dysfunction of cervical region: Secondary | ICD-10-CM | POA: Diagnosis not present

## 2020-11-01 DIAGNOSIS — M9905 Segmental and somatic dysfunction of pelvic region: Secondary | ICD-10-CM | POA: Diagnosis not present

## 2020-11-01 DIAGNOSIS — M9903 Segmental and somatic dysfunction of lumbar region: Secondary | ICD-10-CM | POA: Diagnosis not present

## 2020-11-06 DIAGNOSIS — M9902 Segmental and somatic dysfunction of thoracic region: Secondary | ICD-10-CM | POA: Diagnosis not present

## 2020-11-06 DIAGNOSIS — M9901 Segmental and somatic dysfunction of cervical region: Secondary | ICD-10-CM | POA: Diagnosis not present

## 2020-11-06 DIAGNOSIS — M9905 Segmental and somatic dysfunction of pelvic region: Secondary | ICD-10-CM | POA: Diagnosis not present

## 2020-11-06 DIAGNOSIS — M9903 Segmental and somatic dysfunction of lumbar region: Secondary | ICD-10-CM | POA: Diagnosis not present

## 2020-11-06 DIAGNOSIS — M7541 Impingement syndrome of right shoulder: Secondary | ICD-10-CM | POA: Diagnosis not present

## 2020-11-13 DIAGNOSIS — M9901 Segmental and somatic dysfunction of cervical region: Secondary | ICD-10-CM | POA: Diagnosis not present

## 2020-11-13 DIAGNOSIS — M7541 Impingement syndrome of right shoulder: Secondary | ICD-10-CM | POA: Diagnosis not present

## 2020-11-13 DIAGNOSIS — M9902 Segmental and somatic dysfunction of thoracic region: Secondary | ICD-10-CM | POA: Diagnosis not present

## 2020-11-13 DIAGNOSIS — M9905 Segmental and somatic dysfunction of pelvic region: Secondary | ICD-10-CM | POA: Diagnosis not present

## 2020-11-13 DIAGNOSIS — M9903 Segmental and somatic dysfunction of lumbar region: Secondary | ICD-10-CM | POA: Diagnosis not present

## 2020-11-15 DIAGNOSIS — M9901 Segmental and somatic dysfunction of cervical region: Secondary | ICD-10-CM | POA: Diagnosis not present

## 2020-11-15 DIAGNOSIS — M9905 Segmental and somatic dysfunction of pelvic region: Secondary | ICD-10-CM | POA: Diagnosis not present

## 2020-11-15 DIAGNOSIS — M9903 Segmental and somatic dysfunction of lumbar region: Secondary | ICD-10-CM | POA: Diagnosis not present

## 2020-11-15 DIAGNOSIS — M9902 Segmental and somatic dysfunction of thoracic region: Secondary | ICD-10-CM | POA: Diagnosis not present

## 2020-11-15 DIAGNOSIS — M7541 Impingement syndrome of right shoulder: Secondary | ICD-10-CM | POA: Diagnosis not present

## 2020-11-23 DIAGNOSIS — M7541 Impingement syndrome of right shoulder: Secondary | ICD-10-CM | POA: Diagnosis not present

## 2020-11-23 DIAGNOSIS — M9902 Segmental and somatic dysfunction of thoracic region: Secondary | ICD-10-CM | POA: Diagnosis not present

## 2020-11-23 DIAGNOSIS — M9903 Segmental and somatic dysfunction of lumbar region: Secondary | ICD-10-CM | POA: Diagnosis not present

## 2020-11-23 DIAGNOSIS — M9901 Segmental and somatic dysfunction of cervical region: Secondary | ICD-10-CM | POA: Diagnosis not present

## 2020-11-23 DIAGNOSIS — M9905 Segmental and somatic dysfunction of pelvic region: Secondary | ICD-10-CM | POA: Diagnosis not present

## 2020-11-28 ENCOUNTER — Encounter: Payer: BC Managed Care – PPO | Admitting: Dermatology

## 2020-12-04 DIAGNOSIS — M9902 Segmental and somatic dysfunction of thoracic region: Secondary | ICD-10-CM | POA: Diagnosis not present

## 2020-12-04 DIAGNOSIS — M9905 Segmental and somatic dysfunction of pelvic region: Secondary | ICD-10-CM | POA: Diagnosis not present

## 2020-12-04 DIAGNOSIS — M7541 Impingement syndrome of right shoulder: Secondary | ICD-10-CM | POA: Diagnosis not present

## 2020-12-04 DIAGNOSIS — M9903 Segmental and somatic dysfunction of lumbar region: Secondary | ICD-10-CM | POA: Diagnosis not present

## 2020-12-04 DIAGNOSIS — M9901 Segmental and somatic dysfunction of cervical region: Secondary | ICD-10-CM | POA: Diagnosis not present

## 2020-12-06 DIAGNOSIS — M7541 Impingement syndrome of right shoulder: Secondary | ICD-10-CM | POA: Diagnosis not present

## 2020-12-06 DIAGNOSIS — M9903 Segmental and somatic dysfunction of lumbar region: Secondary | ICD-10-CM | POA: Diagnosis not present

## 2020-12-06 DIAGNOSIS — M9905 Segmental and somatic dysfunction of pelvic region: Secondary | ICD-10-CM | POA: Diagnosis not present

## 2020-12-06 DIAGNOSIS — M9902 Segmental and somatic dysfunction of thoracic region: Secondary | ICD-10-CM | POA: Diagnosis not present

## 2020-12-06 DIAGNOSIS — M9901 Segmental and somatic dysfunction of cervical region: Secondary | ICD-10-CM | POA: Diagnosis not present

## 2020-12-12 DIAGNOSIS — M9905 Segmental and somatic dysfunction of pelvic region: Secondary | ICD-10-CM | POA: Diagnosis not present

## 2020-12-12 DIAGNOSIS — M9902 Segmental and somatic dysfunction of thoracic region: Secondary | ICD-10-CM | POA: Diagnosis not present

## 2020-12-12 DIAGNOSIS — M7541 Impingement syndrome of right shoulder: Secondary | ICD-10-CM | POA: Diagnosis not present

## 2020-12-12 DIAGNOSIS — M9903 Segmental and somatic dysfunction of lumbar region: Secondary | ICD-10-CM | POA: Diagnosis not present

## 2020-12-12 DIAGNOSIS — M9901 Segmental and somatic dysfunction of cervical region: Secondary | ICD-10-CM | POA: Diagnosis not present

## 2020-12-13 DIAGNOSIS — M9905 Segmental and somatic dysfunction of pelvic region: Secondary | ICD-10-CM | POA: Diagnosis not present

## 2020-12-13 DIAGNOSIS — M9902 Segmental and somatic dysfunction of thoracic region: Secondary | ICD-10-CM | POA: Diagnosis not present

## 2020-12-13 DIAGNOSIS — M9903 Segmental and somatic dysfunction of lumbar region: Secondary | ICD-10-CM | POA: Diagnosis not present

## 2020-12-13 DIAGNOSIS — M9901 Segmental and somatic dysfunction of cervical region: Secondary | ICD-10-CM | POA: Diagnosis not present

## 2020-12-20 DIAGNOSIS — M7541 Impingement syndrome of right shoulder: Secondary | ICD-10-CM | POA: Diagnosis not present

## 2020-12-20 DIAGNOSIS — M9902 Segmental and somatic dysfunction of thoracic region: Secondary | ICD-10-CM | POA: Diagnosis not present

## 2020-12-20 DIAGNOSIS — M9905 Segmental and somatic dysfunction of pelvic region: Secondary | ICD-10-CM | POA: Diagnosis not present

## 2020-12-20 DIAGNOSIS — M9903 Segmental and somatic dysfunction of lumbar region: Secondary | ICD-10-CM | POA: Diagnosis not present

## 2020-12-20 DIAGNOSIS — M9901 Segmental and somatic dysfunction of cervical region: Secondary | ICD-10-CM | POA: Diagnosis not present

## 2020-12-27 DIAGNOSIS — M9901 Segmental and somatic dysfunction of cervical region: Secondary | ICD-10-CM | POA: Diagnosis not present

## 2020-12-27 DIAGNOSIS — M9905 Segmental and somatic dysfunction of pelvic region: Secondary | ICD-10-CM | POA: Diagnosis not present

## 2020-12-27 DIAGNOSIS — M9902 Segmental and somatic dysfunction of thoracic region: Secondary | ICD-10-CM | POA: Diagnosis not present

## 2020-12-27 DIAGNOSIS — M9903 Segmental and somatic dysfunction of lumbar region: Secondary | ICD-10-CM | POA: Diagnosis not present

## 2020-12-27 DIAGNOSIS — M7541 Impingement syndrome of right shoulder: Secondary | ICD-10-CM | POA: Diagnosis not present

## 2021-01-01 DIAGNOSIS — M7541 Impingement syndrome of right shoulder: Secondary | ICD-10-CM | POA: Diagnosis not present

## 2021-01-01 DIAGNOSIS — M9903 Segmental and somatic dysfunction of lumbar region: Secondary | ICD-10-CM | POA: Diagnosis not present

## 2021-01-01 DIAGNOSIS — M9901 Segmental and somatic dysfunction of cervical region: Secondary | ICD-10-CM | POA: Diagnosis not present

## 2021-01-01 DIAGNOSIS — M9905 Segmental and somatic dysfunction of pelvic region: Secondary | ICD-10-CM | POA: Diagnosis not present

## 2021-01-01 DIAGNOSIS — M9902 Segmental and somatic dysfunction of thoracic region: Secondary | ICD-10-CM | POA: Diagnosis not present

## 2021-01-16 DIAGNOSIS — M9902 Segmental and somatic dysfunction of thoracic region: Secondary | ICD-10-CM | POA: Diagnosis not present

## 2021-01-16 DIAGNOSIS — M7541 Impingement syndrome of right shoulder: Secondary | ICD-10-CM | POA: Diagnosis not present

## 2021-01-16 DIAGNOSIS — M9905 Segmental and somatic dysfunction of pelvic region: Secondary | ICD-10-CM | POA: Diagnosis not present

## 2021-01-16 DIAGNOSIS — M9903 Segmental and somatic dysfunction of lumbar region: Secondary | ICD-10-CM | POA: Diagnosis not present

## 2021-01-16 DIAGNOSIS — M9901 Segmental and somatic dysfunction of cervical region: Secondary | ICD-10-CM | POA: Diagnosis not present

## 2021-01-18 DIAGNOSIS — M9903 Segmental and somatic dysfunction of lumbar region: Secondary | ICD-10-CM | POA: Diagnosis not present

## 2021-01-18 DIAGNOSIS — M5412 Radiculopathy, cervical region: Secondary | ICD-10-CM | POA: Diagnosis not present

## 2021-01-18 DIAGNOSIS — M9901 Segmental and somatic dysfunction of cervical region: Secondary | ICD-10-CM | POA: Diagnosis not present

## 2021-01-18 DIAGNOSIS — M62830 Muscle spasm of back: Secondary | ICD-10-CM | POA: Diagnosis not present

## 2021-01-22 ENCOUNTER — Telehealth: Payer: Self-pay | Admitting: Family Medicine

## 2021-01-22 DIAGNOSIS — Z0000 Encounter for general adult medical examination without abnormal findings: Secondary | ICD-10-CM

## 2021-01-22 NOTE — Telephone Encounter (Signed)
-----   Message from Ellamae Sia sent at 01/08/2021  9:56 AM EDT ----- Regarding: Lab orders for Tuesday, 9.6.22 Patient is scheduled for CPX labs, please order future labs, Thanks , Karna Christmas

## 2021-01-23 ENCOUNTER — Other Ambulatory Visit: Payer: BC Managed Care – PPO

## 2021-01-26 ENCOUNTER — Encounter: Payer: BC Managed Care – PPO | Admitting: Family Medicine

## 2021-02-09 IMAGING — MG DIGITAL DIAGNOSTIC UNILATERAL LEFT MAMMOGRAM WITH TOMO AND CAD
6 of 9 series · 6 of 25 positions shown · non-contrast
Comparison: Previous exam(s).

CLINICAL DATA: Screening recall for a possible left breast mass.
After her call back, the patient did physical exam and believe she
may feel a lump along the lateral left breast. There is also
question of bilateral skin thickening on her screening mammogram.
Patient has family history of breast cancer in her maternal
grandmother.

EXAM:
DIGITAL DIAGNOSTIC LEFT MAMMOGRAM WITH CAD AND TOMO
ULTRASOUND LEFT BREAST

[L CC]
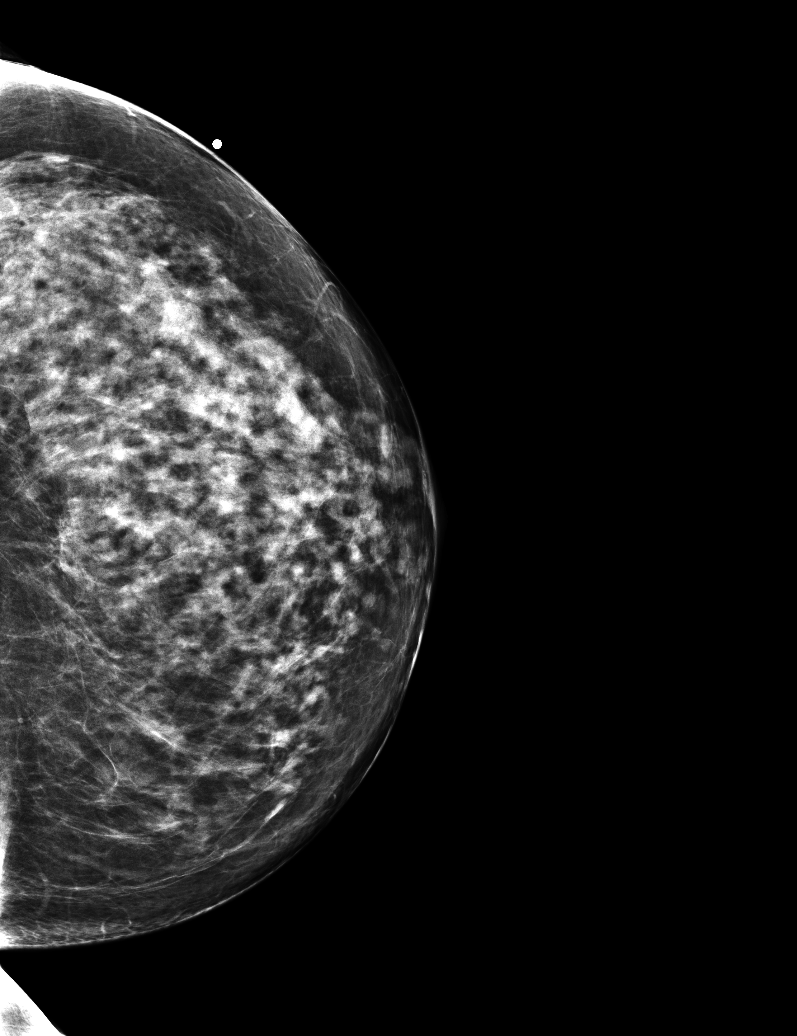

[L MLO synth-2D (1 of 2)]
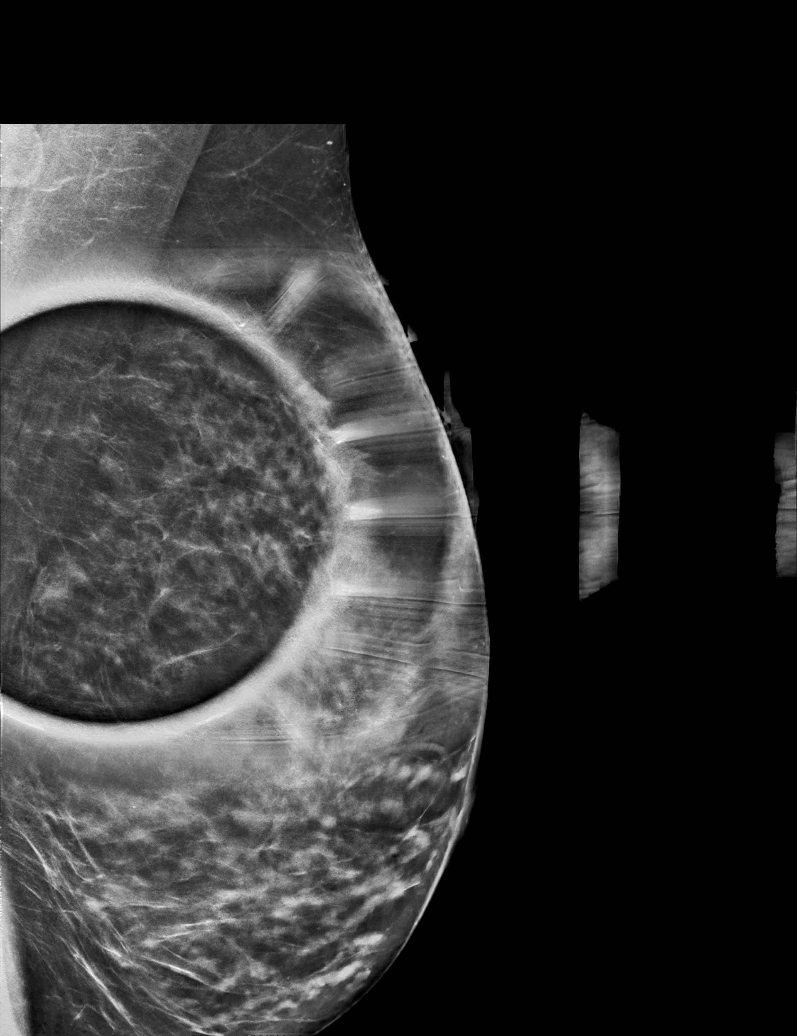

[L ML synth-2D]
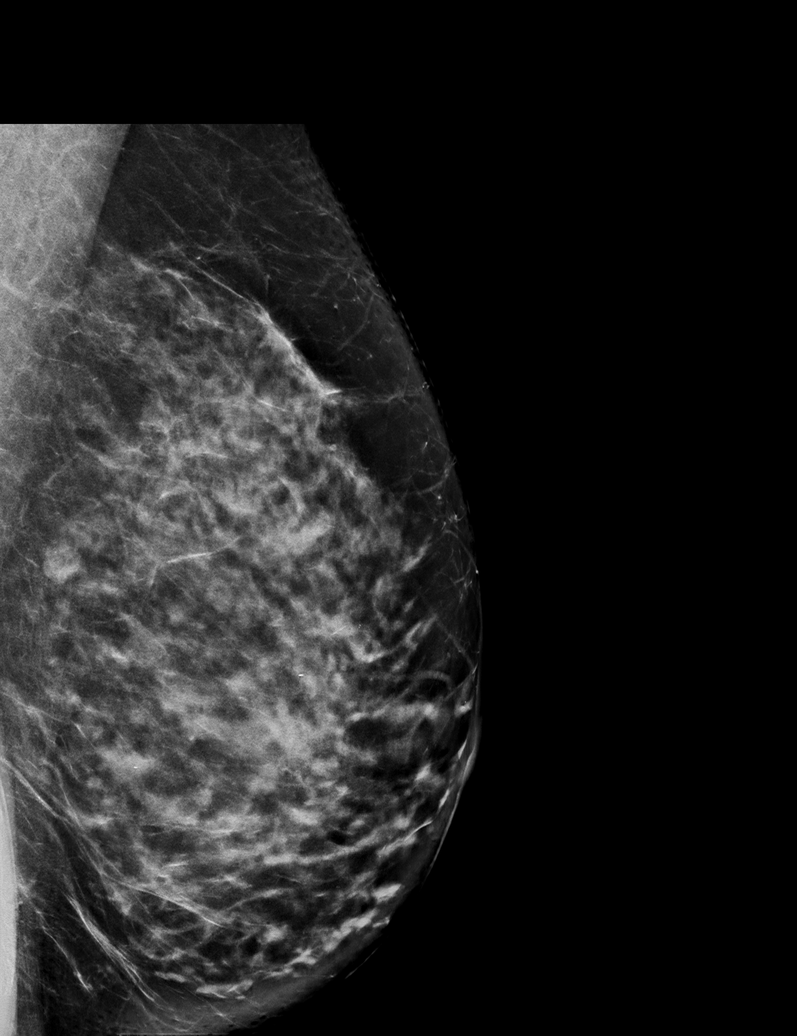

[L MLO synth-2D (2 of 2)]
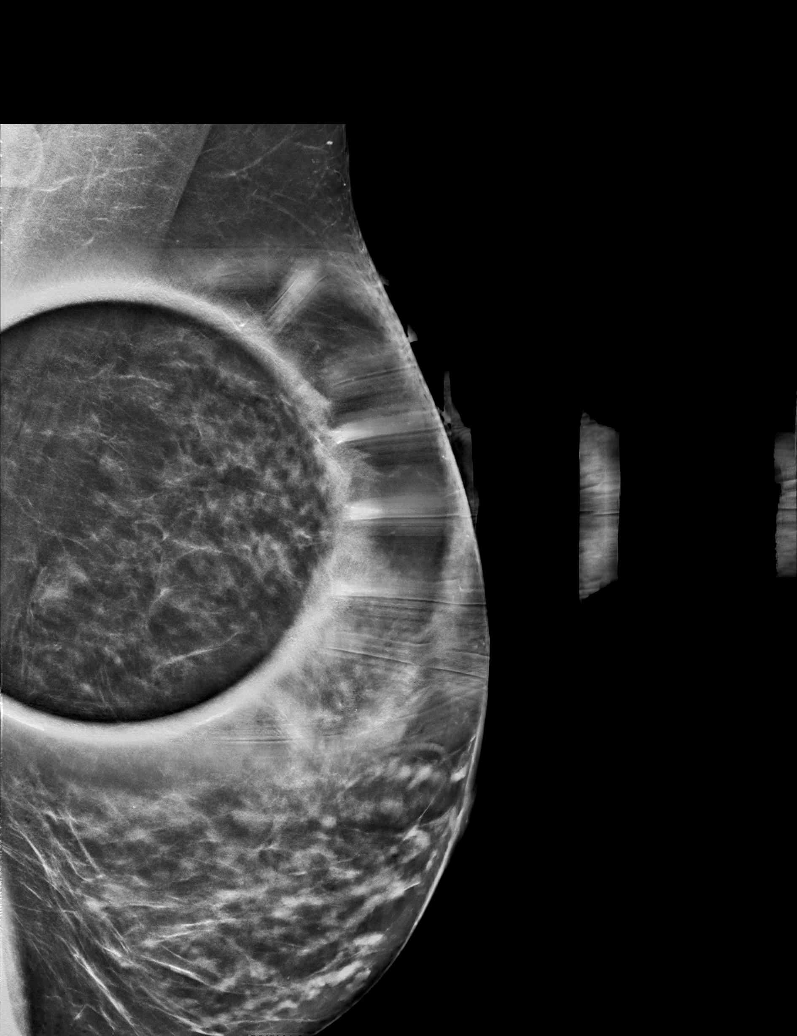

[L XCCL synth-2D]
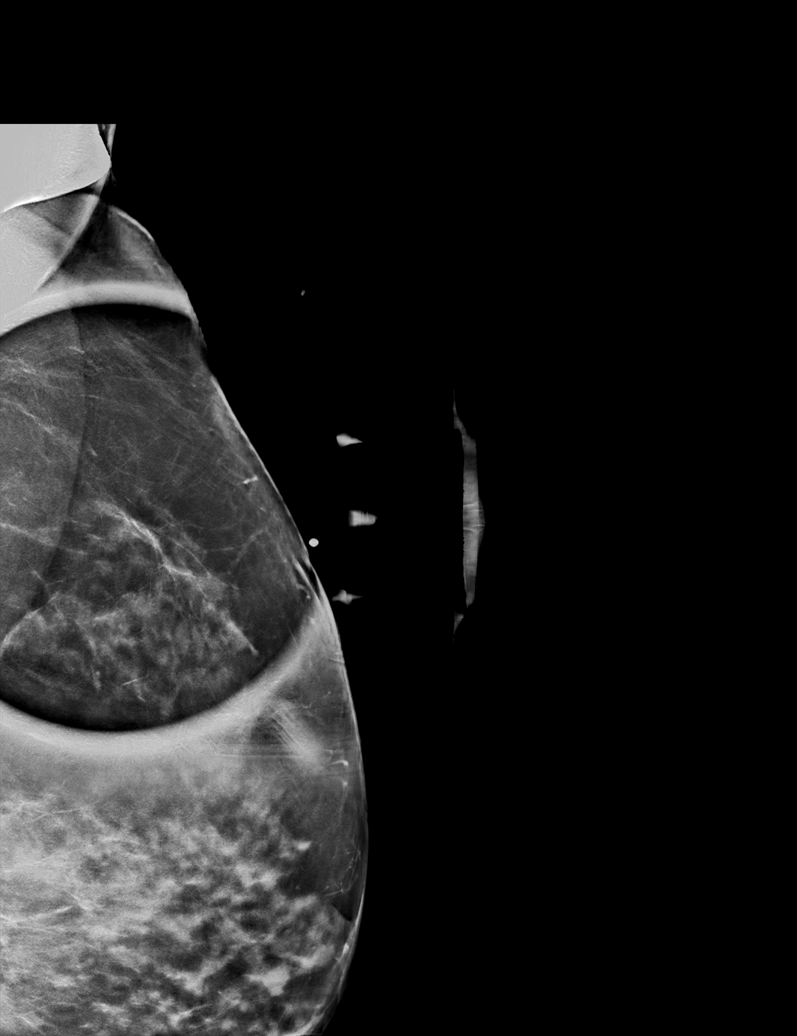

[L MLO tomo · tomo slice 41/80.0]
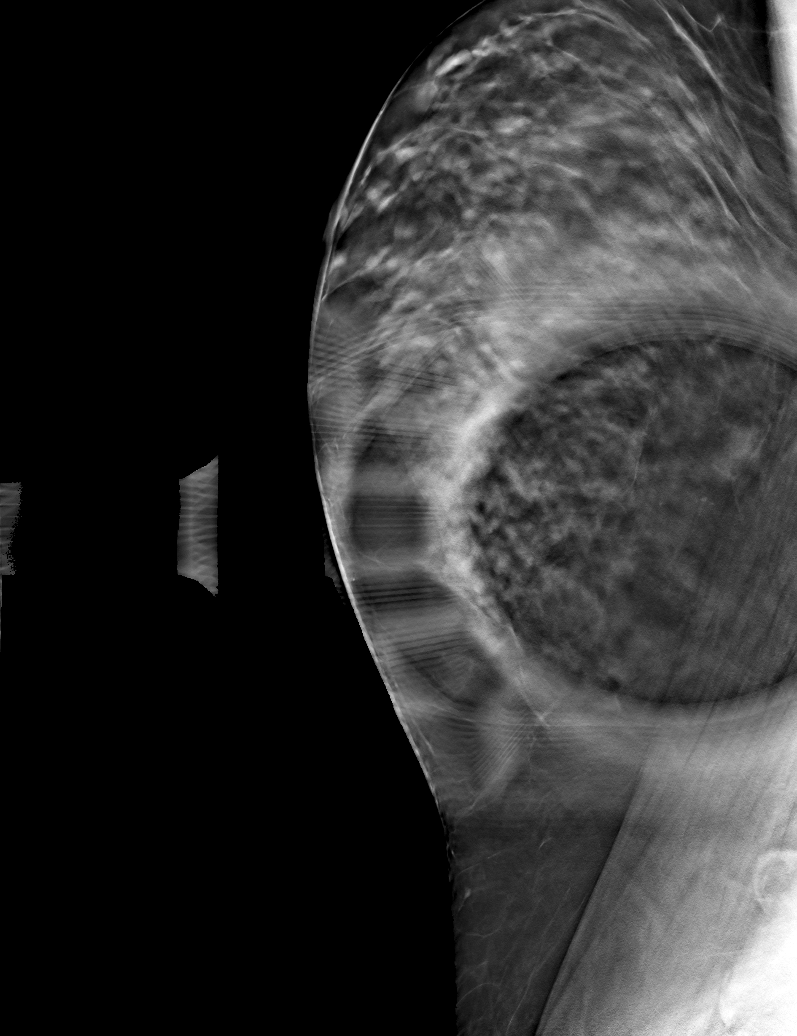

[6 of 25 positions shown; findings below may reference images not displayed]

ACR Breast Density Category c: The breast tissue is heterogeneously
dense, which may obscure small masses.
FINDINGS: A BB has been placed on the palpable area of concern in the lateral
left breast. No suspicious masses are identified deep to the
palpable marker. Spot compression tomosynthesis images through the
area of concern in the central posterior left breast demonstrates a
persistent irregular mass measuring approximately 1.1 cm. A full
paddle CC 2D image of the left breast was performed to further
evaluate the question of skin thickening. The skin is normal in
thickness on this 2D image, and the apparent skin thickening on the
screening mammogram is artifactual due to the imaging technique.

Mammographic images were processed with CAD.

On physical exam, no suspicious palpable masses are identified in
the upper-outer quadrant and lateral aspect of the left breast. The
skin of the left breast appears normal. No suspicious palpable
masses are identified at the palpable site in the upper-outer left
breast.

Targeted ultrasound is performed, showing multiple benign clusters
of cysts in the lateral to upper outer left breast. The area I
believe corresponds with the mass identified mammographically is at
[DATE], 5 cm from the nipple and measures 0.7 x 0.4 x 0.6 cm. No
suspicious masses or areas of shadowing are identified.
IMPRESSION: 1.  The mass on the mammogram corresponds with a benign cyst.

2. The skin thickening question on the screening mammogram is
artifactual, and this skin is normal.

3. No suspicious mammographic or targeted sonographic abnormalities
are seen at the palpable site in the upper-outer left breast.

RECOMMENDATION:
1.  Screening mammogram in one year.(Code:TR-Y-ORU)

I have discussed the findings and recommendations with the patient.
Results were also provided in writing at the conclusion of the
visit. If applicable, a reminder letter will be sent to the patient
regarding the next appointment.

BI-RADS CATEGORY  2: Benign.

## 2021-03-08 DIAGNOSIS — M5412 Radiculopathy, cervical region: Secondary | ICD-10-CM | POA: Diagnosis not present

## 2021-03-08 DIAGNOSIS — M9903 Segmental and somatic dysfunction of lumbar region: Secondary | ICD-10-CM | POA: Diagnosis not present

## 2021-03-08 DIAGNOSIS — M62830 Muscle spasm of back: Secondary | ICD-10-CM | POA: Diagnosis not present

## 2021-03-08 DIAGNOSIS — M9901 Segmental and somatic dysfunction of cervical region: Secondary | ICD-10-CM | POA: Diagnosis not present

## 2021-03-12 ENCOUNTER — Encounter: Payer: Self-pay | Admitting: Family Medicine

## 2021-03-12 ENCOUNTER — Ambulatory Visit (INDEPENDENT_AMBULATORY_CARE_PROVIDER_SITE_OTHER): Payer: BC Managed Care – PPO | Admitting: Family Medicine

## 2021-03-12 ENCOUNTER — Other Ambulatory Visit: Payer: Self-pay

## 2021-03-12 VITALS — BP 106/70 | HR 75 | Temp 98.4°F | Ht 62.0 in | Wt 153.0 lb

## 2021-03-12 DIAGNOSIS — Z0000 Encounter for general adult medical examination without abnormal findings: Secondary | ICD-10-CM | POA: Diagnosis not present

## 2021-03-12 DIAGNOSIS — Z1211 Encounter for screening for malignant neoplasm of colon: Secondary | ICD-10-CM | POA: Diagnosis not present

## 2021-03-12 DIAGNOSIS — R7309 Other abnormal glucose: Secondary | ICD-10-CM

## 2021-03-12 DIAGNOSIS — Z23 Encounter for immunization: Secondary | ICD-10-CM

## 2021-03-12 DIAGNOSIS — R5382 Chronic fatigue, unspecified: Secondary | ICD-10-CM

## 2021-03-12 DIAGNOSIS — R229 Localized swelling, mass and lump, unspecified: Secondary | ICD-10-CM | POA: Insufficient documentation

## 2021-03-12 LAB — CBC WITH DIFFERENTIAL/PLATELET
Basophils Absolute: 0.1 10*3/uL (ref 0.0–0.1)
Basophils Relative: 1.1 % (ref 0.0–3.0)
Eosinophils Absolute: 0.3 10*3/uL (ref 0.0–0.7)
Eosinophils Relative: 3.9 % (ref 0.0–5.0)
HCT: 40.7 % (ref 36.0–46.0)
Hemoglobin: 13.7 g/dL (ref 12.0–15.0)
Lymphocytes Relative: 23.7 % (ref 12.0–46.0)
Lymphs Abs: 1.8 10*3/uL (ref 0.7–4.0)
MCHC: 33.7 g/dL (ref 30.0–36.0)
MCV: 91.1 fl (ref 78.0–100.0)
Monocytes Absolute: 0.5 10*3/uL (ref 0.1–1.0)
Monocytes Relative: 7.1 % (ref 3.0–12.0)
Neutro Abs: 4.7 10*3/uL (ref 1.4–7.7)
Neutrophils Relative %: 64.2 % (ref 43.0–77.0)
Platelets: 283 10*3/uL (ref 150.0–400.0)
RBC: 4.46 Mil/uL (ref 3.87–5.11)
RDW: 13 % (ref 11.5–15.5)
WBC: 7.4 10*3/uL (ref 4.0–10.5)

## 2021-03-12 LAB — COMPREHENSIVE METABOLIC PANEL
ALT: 19 U/L (ref 0–35)
AST: 19 U/L (ref 0–37)
Albumin: 4.6 g/dL (ref 3.5–5.2)
Alkaline Phosphatase: 69 U/L (ref 39–117)
BUN: 15 mg/dL (ref 6–23)
CO2: 26 mEq/L (ref 19–32)
Calcium: 9.8 mg/dL (ref 8.4–10.5)
Chloride: 103 mEq/L (ref 96–112)
Creatinine, Ser: 0.81 mg/dL (ref 0.40–1.20)
GFR: 86.78 mL/min (ref >60.00)
Glucose, Bld: 90 mg/dL (ref 70–99)
Potassium: 4.6 mEq/L (ref 3.5–5.1)
Sodium: 139 mEq/L (ref 135–145)
Total Bilirubin: 0.6 mg/dL (ref 0.2–1.2)
Total Protein: 7.8 g/dL (ref 6.0–8.3)

## 2021-03-12 LAB — HEMOGLOBIN A1C: Hgb A1c MFr Bld: 5 % (ref 4.6–6.5)

## 2021-03-12 LAB — LIPID PANEL
Cholesterol: 182 mg/dL (ref 0–200)
HDL: 52.9 mg/dL (ref >39.00)
LDL Cholesterol: 108 mg/dL — ABNORMAL HIGH (ref 0–99)
NonHDL: 128.77
Total CHOL/HDL Ratio: 3
Triglycerides: 102 mg/dL (ref 0.0–149.0)
VLDL: 20.4 mg/dL (ref 0.0–40.0)

## 2021-03-12 LAB — TSH: TSH: 1.41 u[IU]/mL (ref 0.35–5.50)

## 2021-03-12 NOTE — Assessment & Plan Note (Signed)
Pt feels a lump in skin over R mandible Not painful  I am not able to appreciate it on exam  Enc her to have dermatology eval at upcoming appt Dr Nicole Kindred in nov  Consider ENT if needed

## 2021-03-12 NOTE — Assessment & Plan Note (Signed)
Reviewed health habits including diet and exercise and skin cancer prevention Reviewed appropriate screening tests for age  Also reviewed health mt list, fam hx and immunization status , as well as social and family history   See HPI Labs ordered  Discussed stressors and enc continued counseling if needed (she is dealing well)  ifob kit ordered / D/w patient [id number] for colon cancer screening, including IFOB vs. colonoscopy.  Risks and benefits of both were discussed and patient voiced understanding.  Pt elects for: ifob  Given info on options  Td and flu shots given today  Did not get covid booster due to poor tolerance of last shot Mammogram is due in December, we will order when she is ready to schedule it  She plans to schedule annual gyn visit with new office

## 2021-03-12 NOTE — Patient Instructions (Addendum)
Think about what you would like to do for colon screening -let us know, here is a handout  We will order the stool kit   Have a dermatologist check out the spot on your jaw   Take care of yourself   Stay active   Get your mammogram as planned  You are due on  December 15th  Call us when you make the appt and I will put the order in   Labs today  Flu shot and tetanus shot today   Try to get most of your carbohydrates from produce (with the exception of white potatoes)  Eat less bread/pasta/rice/snack foods/cereals/sweets and other items from the middle of the grocery store (processed carbs)

## 2021-03-12 NOTE — Assessment & Plan Note (Signed)
D/w patient [id number] for colon cancer screening, including IFOB vs. colonoscopy.  Risks and benefits of both were discussed and patient voiced understanding.  Pt elects for: ifob kit May consider colonoscopy later  Given handout re: options

## 2021-03-12 NOTE — Assessment & Plan Note (Signed)
a1c done disc imp of low glycemic diet and wt loss to prevent DM2

## 2021-03-12 NOTE — Progress Notes (Signed)
Subjective:    Patient ID: Rebecca Peters, female    DOB: 09-05-1975, 45 y.o.   MRN: [id number]  This visit occurred during the SARS-CoV-2 public health emergency.  Safety protocols were in place, including screening questions prior to the visit, additional usage of staff PPE, and extensive cleaning of exam room while observing appropriate contact time as indicated for disinfecting solutions.   HPI Here for health maintenance exam and to review chronic medical problems    Wt Readings from Last 3 Encounters:  03/12/21 153 lb (69.4 kg)  03/09/18 147 lb (66.7 kg)  04/23/17 144 lb 12 oz (65.7 kg)   27.98 kg/m  Doing ok overall  Ups and downs  Has had a lot going on   Some stress and some anxiety and life changes  Daughter started college and father passed away of dementia  Daughter was in 76 - was on life support /now is back at school (bad summer)  Also husb asked for a divorce   Some anhedonia Tired all the time also   She was in counseling for a while and then stopped  Was out of work so much  A lot of worry   Trying to take care of herself    Covid status-vaccinated (the 2nd one made her very sick)  Afraid to get the next booster Td 12/11- due for that Flu shot - will do today   Colon cancer screening  Not ready for a colonoscopy yet  Will do ifob    Pap 1/18  Sees gyn -last visit about a year ago may  Her gyn left  She saw [facility] - at [place] are shorter but regular     Mammogram 04/2020  Self breast exam -no lumps   Due for labs  H/o past elevated glucose  Has a lump over R jaw/mandible -not bony  Not painful Thinks it is mobile    Patient Active Problem List   Diagnosis Date Noted   Colon cancer screening 03/12/2021   Lump of skin 03/12/2021   Fibrocystic disease of both breasts 06/20/2014   Lump in the abdomen 06/20/2014   Routine general medical examination at a health care facility 03/24/2011   Screening for lipoid  disorders 03/22/2011   Elevated glucose level 03/22/2011   H/O abnormal cervical Papanicolaou smear 05/17/2010   COCCYGEAL PAIN 05/15/2010   SKIN CANCER, HX OF 05/15/2010   Past Medical History:  Diagnosis Date   Cancer ([place])    melonoma   Dysplasia of cervix, unspecified    Dysplastic nevus 05/10/2010   Left mid back. Mild atypia. Close to margin.   Dysplastic nevus 02/22/2014   Right central upper abdomen. Mild atypia. Limited margins free.   Dysplastic nevus 02/22/2014   Left flank. Moderate atypia. Lateral margin involved.   Dysplastic nevus 01/16/2015   Right spinal upper back. Mild atypia. Limited margins free.    Dysplastic nevus 02/04/2017   Right medial breast. Mild atypia. Deep margin focally involved.    Dysplastic nevus 02/04/2017   Left upper back. Mild atypia. Limited margins free.    Late menses    long cycle---menses every 6 weeks   Melanoma ([place]) 02/08/2010   Right lower leg. Compound melanocytic proliferation with severe atypia, best interpreted as Melanoma. Clarkes level: IV. Breslow's 1.71m[facility]    Past Surgical History:  Procedure Laterality Date   APPENDECTOMY  5/10  COLPOSCOPY  12/11   MELANOMA EXCISION  10/11   lymphnodes stage 2; wide excision with 2 LN removed   Social History   Tobacco Use   Smoking status: Never   Smokeless tobacco: Never  Substance Use Topics   Alcohol use: Yes    Alcohol/week: 0.0 standard drinks    Comment: 1 drink every few months   Drug use: No   Family History  Problem Relation Age of Onset   Hyperlipidemia Father    Hypertension Father    Hyperlipidemia Mother    Breast cancer Maternal Grandmother    Cancer Neg Hx    Allergies  Allergen Reactions   Metoclopramide Hcl     REACTION: trouble breathing and muscle spasms to face   No current outpatient medications on file prior to visit.   No current facility-administered medications on file  prior to visit.     Review of Systems  Constitutional:  Negative for activity change, appetite change, fatigue, fever and unexpected weight change.  HENT:  Negative for congestion, ear pain, rhinorrhea, sinus pressure and sore throat.        Lump in skin of R jaw/mandible  Eyes:  Negative for pain, redness and visual disturbance.  Respiratory:  Negative for cough, shortness of breath and wheezing.   Cardiovascular:  Negative for chest pain and palpitations.  Gastrointestinal:  Negative for abdominal pain, blood in stool, constipation and diarrhea.  Endocrine: Negative for polydipsia and polyuria.  Genitourinary:  Negative for dysuria, frequency and urgency.  Musculoskeletal:  Negative for arthralgias, back pain and myalgias.  Skin:  Negative for pallor and rash.  Allergic/Immunologic: Negative for environmental allergies.  Neurological:  Negative for dizziness, syncope and headaches.  Hematological:  Negative for adenopathy. Does not bruise/bleed easily.  Psychiatric/Behavioral:  Negative for decreased concentration and dysphoric mood. The patient is nervous/anxious.        Some recent anhedonia      Objective:   Physical Exam Constitutional:      General: She is not in acute distress.    Appearance: Normal appearance. She is well-developed and normal weight. She is not ill-appearing or diaphoretic.  HENT:     Head: Normocephalic and atraumatic.     Right Ear: Tympanic membrane, ear canal and external ear normal.     Left Ear: Tympanic membrane, ear canal and external ear normal.     Ears:     Comments: TMs are slightly dull    Nose: Nose normal. No congestion.     Mouth/Throat:     Mouth: Mucous membranes are moist.     Pharynx: Oropharynx is clear. No posterior oropharyngeal erythema.  Eyes:     General: No scleral icterus.    Extraocular Movements: Extraocular movements intact.     Conjunctiva/sclera: Conjunctivae normal.     Pupils: Pupils are equal, round, and reactive  to light.  Neck:     Thyroid: No thyromegaly.     Vascular: No carotid bruit or JVD.  Cardiovascular:     Rate and Rhythm: Normal rate and regular rhythm.     Pulses: Normal pulses.     Heart sounds: Normal heart sounds.    No gallop.  Pulmonary:     Effort: Pulmonary effort is normal. No respiratory distress.     Breath sounds: Normal breath sounds. No wheezing.     Comments: Good air exch Chest:     Chest wall: No tenderness.  Abdominal:     General: Abdomen is flat. Bowel  sounds are normal. There is no distension or abdominal bruit.     Palpations: Abdomen is soft. There is no mass.     Tenderness: There is no abdominal tenderness.     Hernia: No hernia is present.  Genitourinary:    Comments: Breast and pelvic exam are done by gyn specialist   Musculoskeletal:        General: No tenderness. Normal range of motion.     Cervical back: Normal range of motion and neck supple. No rigidity. No muscular tenderness.     Right lower leg: No edema.     Left lower leg: No edema.  Lymphadenopathy:     Cervical: No cervical adenopathy.  Skin:    General: Skin is warm and dry.     Coloration: Skin is not pale.     Findings: No erythema or rash.     Comments: Solar lentigines diffusely  Unable to appreciate skin change over R mandible where pt feels lump  Neurological:     Mental Status: She is alert. Mental status is at baseline.     Cranial Nerves: No cranial nerve deficit.     Motor: No abnormal muscle tone.     Coordination: Coordination normal.     Gait: Gait normal.     Deep Tendon Reflexes: Reflexes are normal and symmetric. Reflexes normal.  Psychiatric:        Mood and Affect: Mood normal.        Cognition and Memory: Cognition and memory normal.     Comments: Mood is good despite recent stressors  She talks candidly about that           Assessment & Plan:   Problem List Items Addressed This Visit       Other   Elevated glucose level    a1c done disc imp of  low glycemic diet and wt loss to prevent DM2       Routine general medical examination at a health care facility - Primary    Reviewed health habits including diet and exercise and skin cancer prevention Reviewed appropriate screening tests for age  Also reviewed health mt list, fam hx and immunization status , as well as social and family history   See HPI Labs ordered  Discussed stressors and enc continued counseling if needed (she is dealing well)  ifob kit ordered / D/w patient [id number] for colon cancer screening, including IFOB vs. colonoscopy.  Risks and benefits of both were discussed and patient voiced understanding.  Pt elects for: ifob  Given info on options  Td and flu shots given today  Did not get covid booster due to poor tolerance of last shot Mammogram is due in December, we will order when she is ready to schedule it  She plans to schedule annual gyn visit with new office         Colon cancer screening    D/w patient [id number] for colon cancer screening, including IFOB vs. colonoscopy.  Risks and benefits of both were discussed and patient voiced understanding.  Pt elects for: ifob kit May consider colonoscopy later  Given handout re: options       Relevant Orders   Fecal occult blood, imunochemical([facility])   Lump of skin    Pt feels a lump in skin over R mandible Not painful  I am not able to appreciate it on exam  Enc her to have dermatology eval at upcoming appt Dr Nicole Kindred in nov  Consider ENT if  needed       Other Visit Diagnoses     Need for influenza vaccination       Relevant Orders   Flu Vaccine QUAD 6+ mos PF IM (Fluarix Quad PF) (Completed)   Need for Td vaccine       Relevant Orders   Td vaccine greater than or equal to 7yo preservative free IM (Completed)

## 2021-03-13 DIAGNOSIS — R5383 Other fatigue: Secondary | ICD-10-CM | POA: Insufficient documentation

## 2021-03-13 NOTE — Telephone Encounter (Signed)
Pt asking about "Full nutrient panel" due to feeling as though something is wrong in her body, please advise / order labs

## 2021-03-13 NOTE — Telephone Encounter (Signed)
I can order an iron level and B12 and vitamin D Those are the only nutritional studies I am familiar with. I will use the code fatigue (but no promise that your insurance will cover them)   Theses are not part of regular health maintenance lab work.    I placed orders for [facility]   Let me know if your diet is very restricted in any way (getting enough fruit/veg and protein)   I placed the orders for [facility] clinic collect-let me know if I did not do that right

## 2021-03-15 ENCOUNTER — Other Ambulatory Visit (INDEPENDENT_AMBULATORY_CARE_PROVIDER_SITE_OTHER): Payer: BC Managed Care – PPO

## 2021-03-15 ENCOUNTER — Other Ambulatory Visit: Payer: Self-pay

## 2021-03-15 DIAGNOSIS — R5382 Chronic fatigue, unspecified: Secondary | ICD-10-CM | POA: Diagnosis not present

## 2021-03-15 NOTE — Telephone Encounter (Signed)
Orders updated to [place] lab. Appointment made

## 2021-03-16 LAB — VITAMIN B12: Vitamin B-12: 647 pg/mL (ref 200–1100)

## 2021-03-16 LAB — VITAMIN D 25 HYDROXY (VIT D DEFICIENCY, FRACTURES): Vit D, 25-Hydroxy: 38 ng/mL (ref 30–100)

## 2021-03-16 LAB — IRON: Iron: 105 ug/dL (ref 40–190)

## 2021-03-27 ENCOUNTER — Encounter: Payer: Self-pay | Admitting: Dermatology

## 2021-03-27 ENCOUNTER — Ambulatory Visit (INDEPENDENT_AMBULATORY_CARE_PROVIDER_SITE_OTHER): Payer: BC Managed Care – PPO | Admitting: Dermatology

## 2021-03-27 ENCOUNTER — Other Ambulatory Visit: Payer: Self-pay

## 2021-03-27 DIAGNOSIS — D225 Melanocytic nevi of trunk: Secondary | ICD-10-CM

## 2021-03-27 DIAGNOSIS — L249 Irritant contact dermatitis, unspecified cause: Secondary | ICD-10-CM | POA: Diagnosis not present

## 2021-03-27 DIAGNOSIS — L570 Actinic keratosis: Secondary | ICD-10-CM | POA: Diagnosis not present

## 2021-03-27 DIAGNOSIS — Z1283 Encounter for screening for malignant neoplasm of skin: Secondary | ICD-10-CM | POA: Diagnosis not present

## 2021-03-27 DIAGNOSIS — D229 Melanocytic nevi, unspecified: Secondary | ICD-10-CM

## 2021-03-27 DIAGNOSIS — Z85820 Personal history of malignant melanoma of skin: Secondary | ICD-10-CM

## 2021-03-27 DIAGNOSIS — D2271 Melanocytic nevi of right lower limb, including hip: Secondary | ICD-10-CM

## 2021-03-27 DIAGNOSIS — L821 Other seborrheic keratosis: Secondary | ICD-10-CM

## 2021-03-27 DIAGNOSIS — L578 Other skin changes due to chronic exposure to nonionizing radiation: Secondary | ICD-10-CM

## 2021-03-27 DIAGNOSIS — Z86018 Personal history of other benign neoplasm: Secondary | ICD-10-CM

## 2021-03-27 DIAGNOSIS — D2272 Melanocytic nevi of left lower limb, including hip: Secondary | ICD-10-CM

## 2021-03-27 DIAGNOSIS — L814 Other melanin hyperpigmentation: Secondary | ICD-10-CM

## 2021-03-27 MED ORDER — OPZELURA 1.5 % EX CREA: 1.0000 "application " | TOPICAL_CREAM | Freq: Two times a day (BID) | CUTANEOUS | 1 refills | Status: DC

## 2021-03-27 NOTE — Patient Instructions (Addendum)
Cryotherapy Aftercare  Wash gently with soap and water everyday.   Apply Vaseline and Band-Aid daily until healed.   Melanoma ABCDEs  Melanoma is the most dangerous type of skin cancer, and is the leading cause of death from skin disease.  You are more likely to develop melanoma if you: Have light-colored skin, light-colored eyes, or red or blond hair Spend a lot of time in the sun Tan regularly, either outdoors or in a tanning bed Have had blistering sunburns, especially during childhood Have a close family member who has had a melanoma Have atypical moles or large birthmarks  Early detection of melanoma is key since treatment is typically straightforward and cure rates are extremely high if we catch it early.   The first sign of melanoma is often a change in a mole or a new dark spot.  The ABCDE system is a way of remembering the signs of melanoma.  A for asymmetry:  The two halves do not match. B for border:  The edges of the growth are irregular. C for color:  A mixture of colors are present instead of an even brown color. D for diameter:  Melanomas are usually (but not always) greater than 33mm - the size of a pencil eraser. E for evolution:  The spot keeps changing in size, shape, and color.  Please check your skin once per month between visits. You can use a small mirror in front and a large mirror behind you to keep an eye on the back side or your body.   If you see any new or changing lesions before your next follow-up, please call to schedule a visit.  Please continue daily skin protection including broad spectrum sunscreen SPF 30+ to sun-exposed areas, reapplying every 2 hours as needed when you're outdoors.   Staying in the shade or wearing long sleeves, sun glasses (UVA+UVB protection) and wide brim hats (4-inch brim around the entire circumference of the hat) are also recommended for sun protection.    If you have any questions or concerns for your doctor, please call  our main line at [phone] and press option 4 to reach your doctor's medical assistant. If no one answers, please leave a voicemail as directed and we will return your call as soon as possible. Messages left after 4 pm will be answered the following business day.   You may also send [name] a message via [place]. We typically respond to MyChart messages within 1-2 business days.  For prescription refills, please ask your pharmacy to contact our office. Our fax number is [phone].  If you have an urgent issue when the clinic is closed that cannot wait until the next business day, you can page your doctor at the number below.    Please note that while we do our best to be available for urgent issues outside of office hours, we are not available 24/7.   If you have an urgent issue and are unable to reach [name], you may choose to seek medical care at your doctor's office, retail clinic, urgent care center, or emergency room.  If you have a medical emergency, please immediately call 911 or go to the emergency department.  Pager Numbers  - Dr. Nehemiah Massed: [phone]  - Dr. Laurence Ferrari: [phone]  - Dr. Nicole Kindred: [phone]  In the event of inclement weather, please call our main line at [phone] for an update on the status of any delays or closures.  Dermatology Medication Tips: Please keep the boxes that topical  medications come in in order to help keep track of the instructions about where and how to use these. Pharmacies typically print the medication instructions only on the boxes and not directly on the medication tubes.   If your medication is too expensive, please contact our office at [phone] option 4 or send [name] a message through [place].   We are unable to tell what your co-pay for medications will be in advance as this is different depending on your insurance coverage. However, we may be able to find a substitute medication at lower cost or fill out paperwork to get insurance to  cover a needed medication.   If a prior authorization is required to get your medication covered by your insurance company, please allow [name] 1-2 business days to complete this process.  Drug prices often vary depending on where the prescription is filled and some pharmacies may offer cheaper prices.  The website www.goodrx.com contains coupons for medications through different pharmacies. The prices here do not account for what the cost may be with help from insurance (it may be cheaper with your insurance), but the website can give you the price if you did not use any insurance.  - You can print the associated coupon and take it with your prescription to the pharmacy.  - You may also stop by our office during regular business hours and pick up a GoodRx coupon card.  - If you need your prescription sent electronically to a different pharmacy, notify our office through [facility] or by phone at [phone] option 4.

## 2021-03-27 NOTE — Progress Notes (Signed)
Follow-Up Visit   Subjective  Rebecca Peters is a 45 y.o. female who presents for the following: Annual Exam.  Patient here for TBSE. She has a scaly spot on her right upper forehead/hairline that won't clear. She has a mole on her upper back that she would like checked.   She has a rash that comes and goes on her abdomen for about a year. She rides a bike a lot, and wonders if it could be from sweat and friction. She uses an alcohol wipe prior to showering after she finishes exercising. She has tried TMC 0.1% Cream, but doesn't help much. She uses it for about a week at a time. No history of eczema.  Patient has a history of melanoma of the right medial lower leg (2011) and history of dysplastic nevi.   The following portions of the chart were reviewed this encounter and updated as appropriate:       Review of Systems:  No other skin or systemic complaints except as noted in HPI or Assessment and Plan.  Objective  Well appearing patient in no apparent distress; mood and affect are within normal limits.  A full examination was performed including scalp, head, eyes, ears, nose, lips, neck, chest, axillae, abdomen, back, buttocks, bilateral upper extremities, bilateral lower extremities, hands, feet, fingers, toes, fingernails, and toenails. All findings within normal limits unless otherwise noted below.  back, legs L spinal lower back: 2.51mm med dark brown macule   Right Lower Leg inferior to scar: 2.65mm med dark brown macule   L med calf: 2.32mm med dark brown macule   Right lateral lower leg above ankle: 2.19mm med brown macule  Spinal upper back: Small brown macules and papules (see photo)         R medial lower leg Well healed scar with no evidence of recurrence.   R upper forehead at hairline x 1 Pink scaly macule.  Abdomen Light pink papules/patch in linear band under waistband at lower abdomen   Assessment & Plan  Skin cancer screening performed  today.  Actinic Damage - chronic, secondary to cumulative UV radiation exposure/sun exposure over time - diffuse scaly erythematous macules with underlying dyspigmentation - Recommend daily broad spectrum sunscreen SPF 30+ to sun-exposed areas, reapply every 2 hours as needed.  - Recommend staying in the shade or wearing long sleeves, sun glasses (UVA+UVB protection) and wide brim hats (4-inch brim around the entire circumference of the hat). - Call for new or changing lesions.  History of Dysplastic Nevi - No evidence of recurrence today - Recommend regular full body skin exams - Recommend daily broad spectrum sunscreen SPF 30+ to sun-exposed areas, reapply every 2 hours as needed.  - Call if any new or changing lesions are noted between office visits  Lentigines - Scattered tan macules - Due to sun exposure - Benign-appering, observe - Recommend daily broad spectrum sunscreen SPF 30+ to sun-exposed areas, reapply every 2 hours as needed. - Call for any changes  Seborrheic Keratoses - Stuck-on, waxy, tan-brown papules and/or plaques, including R temple hairline  - Benign-appearing - Discussed benign etiology and prognosis. - Observe - Call for any changes  Nevus back, legs  Benign-appearing.  Stable compared to previous photos. Observation.  Call clinic for new or changing moles.  Recommend daily use of broad spectrum spf 30+ sunscreen to sun-exposed areas.   New photos taken today of back.   History of melanoma R medial lower leg  Clarkes level: IV. Breslow's  1.81mm, excised at [facility] 2011  Clear. Observe for recurrence. Call clinic for new or changing lesions.  Recommend regular skin exams, daily broad-spectrum spf 30+ sunscreen use, and photoprotection.    AK (actinic keratosis) R upper forehead at hairline x 1  Actinic keratoses are precancerous spots that appear secondary to cumulative UV radiation exposure/sun exposure over time. They are chronic with expected  duration over 1 year. A portion of actinic keratoses will progress to squamous cell carcinoma of the skin. It is not possible to reliably predict which spots will progress to skin cancer and so treatment is recommended to prevent development of skin cancer.  Recommend daily broad spectrum sunscreen SPF 30+ to sun-exposed areas, reapply every 2 hours as needed.  Recommend staying in the shade or wearing long sleeves, sun glasses (UVA+UVB protection) and wide brim hats (4-inch brim around the entire circumference of the hat). Call for new or changing lesions.  Destruction of lesion - R upper forehead at hairline x 1  Destruction method: cryotherapy   Informed consent: discussed and consent obtained   Lesion destroyed using liquid nitrogen: Yes   Region frozen until ice ball extended beyond lesion: Yes   Outcome: patient tolerated procedure well with no complications   Post-procedure details: wound care instructions given   Additional details:  Prior to procedure, discussed risks of blister formation, small wound, skin dyspigmentation, or rare scar following cryotherapy. Recommend Vaseline ointment to treated areas while healing.   Irritant contact dermatitis, unspecified trigger Abdomen  vs Intertrigo.  Possible irritation from waistband while exercising. Discussed wearing looser clothing while exercising.   Avoid alcohol wipes to this area.   Start Opzelura Cream Apply BID to AA rash dsp 60g 1Rf.  Sample given today.   Recommend mild soap and moisturizing cream 1-2 times daily.  Gentle skin care handout provided.    Ruxolitinib Phosphate (OPZELURA) 1.5 % CREA - Abdomen Apply 1 application topically 2 (two) times daily. Apply to affected areas rash on abdomen.  Return in about 1 year (around 03/27/2022) for TBSE.  IJamesetta Orleans, CMA, am acting as scribe for Brendolyn Patty, MD . Documentation: I have reviewed the above documentation for accuracy and completeness, and I agree with  the above.  Brendolyn Patty MD

## 2021-03-28 DIAGNOSIS — M62830 Muscle spasm of back: Secondary | ICD-10-CM | POA: Diagnosis not present

## 2021-03-28 DIAGNOSIS — M9901 Segmental and somatic dysfunction of cervical region: Secondary | ICD-10-CM | POA: Diagnosis not present

## 2021-03-28 DIAGNOSIS — M9903 Segmental and somatic dysfunction of lumbar region: Secondary | ICD-10-CM | POA: Diagnosis not present

## 2021-03-28 DIAGNOSIS — M5412 Radiculopathy, cervical region: Secondary | ICD-10-CM | POA: Diagnosis not present

## 2021-04-19 ENCOUNTER — Encounter: Payer: Self-pay | Admitting: Family Medicine

## 2021-04-19 DIAGNOSIS — Z1231 Encounter for screening mammogram for malignant neoplasm of breast: Secondary | ICD-10-CM

## 2021-04-23 DIAGNOSIS — M9903 Segmental and somatic dysfunction of lumbar region: Secondary | ICD-10-CM | POA: Diagnosis not present

## 2021-04-23 DIAGNOSIS — M9901 Segmental and somatic dysfunction of cervical region: Secondary | ICD-10-CM | POA: Diagnosis not present

## 2021-04-23 DIAGNOSIS — M62830 Muscle spasm of back: Secondary | ICD-10-CM | POA: Diagnosis not present

## 2021-04-23 DIAGNOSIS — Z1231 Encounter for screening mammogram for malignant neoplasm of breast: Secondary | ICD-10-CM | POA: Insufficient documentation

## 2021-04-23 DIAGNOSIS — M5412 Radiculopathy, cervical region: Secondary | ICD-10-CM | POA: Diagnosis not present

## 2021-07-10 ENCOUNTER — Ambulatory Visit
Admission: RE | Admit: 2021-07-10 | Discharge: 2021-07-10 | Disposition: A | Discharge status: Home / Self Care | Payer: BC Managed Care – PPO | Source: Ambulatory Visit | Attending: Family Medicine | Admitting: Family Medicine

## 2021-07-10 ENCOUNTER — Other Ambulatory Visit: Payer: Self-pay

## 2021-07-10 DIAGNOSIS — Z1231 Encounter for screening mammogram for malignant neoplasm of breast: Secondary | ICD-10-CM | POA: Diagnosis not present

## 2021-07-30 DIAGNOSIS — M9901 Segmental and somatic dysfunction of cervical region: Secondary | ICD-10-CM | POA: Diagnosis not present

## 2021-07-30 DIAGNOSIS — M9902 Segmental and somatic dysfunction of thoracic region: Secondary | ICD-10-CM | POA: Diagnosis not present

## 2021-07-30 DIAGNOSIS — M7541 Impingement syndrome of right shoulder: Secondary | ICD-10-CM | POA: Diagnosis not present

## 2021-07-30 DIAGNOSIS — M9903 Segmental and somatic dysfunction of lumbar region: Secondary | ICD-10-CM | POA: Diagnosis not present

## 2021-07-30 DIAGNOSIS — M9905 Segmental and somatic dysfunction of pelvic region: Secondary | ICD-10-CM | POA: Diagnosis not present

## 2021-09-24 DIAGNOSIS — M25572 Pain in left ankle and joints of left foot: Secondary | ICD-10-CM | POA: Diagnosis not present

## 2021-09-24 DIAGNOSIS — M722 Plantar fascial fibromatosis: Secondary | ICD-10-CM | POA: Diagnosis not present

## 2021-09-24 DIAGNOSIS — M25672 Stiffness of left ankle, not elsewhere classified: Secondary | ICD-10-CM | POA: Diagnosis not present

## 2021-09-27 DIAGNOSIS — M25672 Stiffness of left ankle, not elsewhere classified: Secondary | ICD-10-CM | POA: Diagnosis not present

## 2021-09-27 DIAGNOSIS — M722 Plantar fascial fibromatosis: Secondary | ICD-10-CM | POA: Diagnosis not present

## 2021-09-27 DIAGNOSIS — M25572 Pain in left ankle and joints of left foot: Secondary | ICD-10-CM | POA: Diagnosis not present

## 2021-10-02 DIAGNOSIS — M722 Plantar fascial fibromatosis: Secondary | ICD-10-CM | POA: Diagnosis not present

## 2021-10-02 DIAGNOSIS — M25572 Pain in left ankle and joints of left foot: Secondary | ICD-10-CM | POA: Diagnosis not present

## 2021-10-02 DIAGNOSIS — M25672 Stiffness of left ankle, not elsewhere classified: Secondary | ICD-10-CM | POA: Diagnosis not present

## 2021-10-04 DIAGNOSIS — M25572 Pain in left ankle and joints of left foot: Secondary | ICD-10-CM | POA: Diagnosis not present

## 2021-10-04 DIAGNOSIS — M25672 Stiffness of left ankle, not elsewhere classified: Secondary | ICD-10-CM | POA: Diagnosis not present

## 2021-10-04 DIAGNOSIS — M722 Plantar fascial fibromatosis: Secondary | ICD-10-CM | POA: Diagnosis not present

## 2021-10-10 DIAGNOSIS — M25672 Stiffness of left ankle, not elsewhere classified: Secondary | ICD-10-CM | POA: Diagnosis not present

## 2021-10-10 DIAGNOSIS — M722 Plantar fascial fibromatosis: Secondary | ICD-10-CM | POA: Diagnosis not present

## 2021-10-10 DIAGNOSIS — M25572 Pain in left ankle and joints of left foot: Secondary | ICD-10-CM | POA: Diagnosis not present

## 2021-10-11 DIAGNOSIS — M25572 Pain in left ankle and joints of left foot: Secondary | ICD-10-CM | POA: Diagnosis not present

## 2021-10-11 DIAGNOSIS — M25672 Stiffness of left ankle, not elsewhere classified: Secondary | ICD-10-CM | POA: Diagnosis not present

## 2021-10-11 DIAGNOSIS — M722 Plantar fascial fibromatosis: Secondary | ICD-10-CM | POA: Diagnosis not present

## 2021-10-16 DIAGNOSIS — M25572 Pain in left ankle and joints of left foot: Secondary | ICD-10-CM | POA: Diagnosis not present

## 2021-10-16 DIAGNOSIS — M25672 Stiffness of left ankle, not elsewhere classified: Secondary | ICD-10-CM | POA: Diagnosis not present

## 2021-10-16 DIAGNOSIS — M722 Plantar fascial fibromatosis: Secondary | ICD-10-CM | POA: Diagnosis not present

## 2021-10-18 DIAGNOSIS — M25572 Pain in left ankle and joints of left foot: Secondary | ICD-10-CM | POA: Diagnosis not present

## 2021-10-18 DIAGNOSIS — M25672 Stiffness of left ankle, not elsewhere classified: Secondary | ICD-10-CM | POA: Diagnosis not present

## 2021-10-18 DIAGNOSIS — M722 Plantar fascial fibromatosis: Secondary | ICD-10-CM | POA: Diagnosis not present

## 2021-10-30 ENCOUNTER — Ambulatory Visit (INDEPENDENT_AMBULATORY_CARE_PROVIDER_SITE_OTHER): Payer: BC Managed Care – PPO | Admitting: Dermatology

## 2021-10-30 DIAGNOSIS — L814 Other melanin hyperpigmentation: Secondary | ICD-10-CM | POA: Diagnosis not present

## 2021-10-30 DIAGNOSIS — L820 Inflamed seborrheic keratosis: Secondary | ICD-10-CM

## 2021-10-30 NOTE — Progress Notes (Signed)
   Follow-Up Visit   Subjective  Rebecca Peters is a 46 y.o. female who presents for the following: Skin Problem (Patient reports a spot at mid forehead. ).  The patient has spots, moles and lesions to be evaluated, some may be new or changing and the patient has concerns that these could be cancer.    The following portions of the chart were reviewed this encounter and updated as appropriate:      Review of Systems: No other skin or systemic complaints except as noted in HPI or Assessment and Plan.   Objective  Well appearing patient in no apparent distress; mood and affect are within normal limits.  A focused examination was performed including face. Relevant physical exam findings are noted in the Assessment and Plan.  mid forehead x 1 Erythematous stuck-on, waxy papule    Assessment & Plan  Inflamed seborrheic keratosis mid forehead x 1  Symptomatic, irritating, patient would like treated.  Destruction of lesion - mid forehead x 1  Destruction method: cryotherapy   Informed consent: discussed and consent obtained   Lesion destroyed using liquid nitrogen: Yes   Region frozen until ice ball extended beyond lesion: Yes   Outcome: patient tolerated procedure well with no complications   Post-procedure details: wound care instructions given   Additional details:  Prior to procedure, discussed risks of blister formation, small wound, skin dyspigmentation, or rare scar following cryotherapy. Recommend Vaseline ointment to treated areas while healing.   Lentigines - Scattered tan macules - Due to sun exposure - Benign-appering, observe - Recommend daily broad spectrum sunscreen SPF 30+ to sun-exposed areas, reapply every 2 hours as needed. - Call for any changes  Return for reschedule tbse . I, Ruthell Rummage, CMA, am acting as scribe for Brendolyn Patty, MD.  Documentation: I have reviewed the above documentation for accuracy and completeness, and I agree with the  above.  Brendolyn Patty MD

## 2021-10-30 NOTE — Patient Instructions (Addendum)
Seborrheic Keratosis  What causes seborrheic keratoses? Seborrheic keratoses are harmless, common skin growths that first appear during adult life.  As time goes by, more growths appear.  Some people may develop a large number of them.  Seborrheic keratoses appear on both covered and uncovered body parts.  They are not caused by sunlight.  The tendency to develop seborrheic keratoses can be inherited.  They vary in color from skin-colored to gray, brown, or even black.  They can be either smooth or have a rough, warty surface.   Seborrheic keratoses are superficial and look as if they were stuck on the skin.  Under the microscope this type of keratosis looks like layers upon layers of skin.  That is why at times the top layer may seem to fall off, but the rest of the growth remains and re-grows.    Treatment Seborrheic keratoses do not need to be treated, but can easily be removed in the office.  Seborrheic keratoses often cause symptoms when they rub on clothing or jewelry.  Lesions can be in the way of shaving.  If they become inflamed, they can cause itching, soreness, or burning.  Removal of a seborrheic keratosis can be accomplished by freezing, burning, or surgery. If any spot bleeds, scabs, or grows rapidly, please return to have it checked, as these can be an indication of a skin cancer.  Cryotherapy Aftercare  Wash gently with soap and water everyday.   Apply Vaseline and Band-Aid daily until healed.    Due to recent changes in healthcare laws, you may see results of your pathology and/or laboratory studies on MyChart before the doctors have had a chance to review them. We understand that in some cases there may be results that are confusing or concerning to you. Please understand that not all results are received at the same time and often the doctors may need to interpret multiple results in order to provide you with the best plan of care or course of treatment. Therefore, we ask that you  please give us 2 business days to thoroughly review all your results before contacting the office for clarification. Should we see a critical lab result, you will be contacted sooner.   If You Need Anything After Your Visit  If you have any questions or concerns for your doctor, please call our main line at [phone] and press option 4 to reach your doctor's medical assistant. If no one answers, please leave a voicemail as directed and we will return your call as soon as possible. Messages left after 4 pm will be answered the following business day.   You may also send us a message via MyChart. We typically respond to MyChart messages within 1-2 business days.  For prescription refills, please ask your pharmacy to contact our office. Our fax number is [phone].  If you have an urgent issue when the clinic is closed that cannot wait until the next business day, you can page your doctor at the number below.    Please note that while we do our best to be available for urgent issues outside of office hours, we are not available 24/7.   If you have an urgent issue and are unable to reach us, you may choose to seek medical care at your doctor's office, retail clinic, urgent care center, or emergency room.  If you have a medical emergency, please immediately call 911 or go to the emergency department.  Pager Numbers  - Dr. Kowalski: [phone]  -   Dr. Moye: [phone]  - Dr. Stewart: [phone]  In the event of inclement weather, please call our main line at [phone] for an update on the status of any delays or closures.  Dermatology Medication Tips: Please keep the boxes that topical medications come in in order to help keep track of the instructions about where and how to use these. Pharmacies typically print the medication instructions only on the boxes and not directly on the medication tubes.   If your medication is too expensive, please contact our office at  [phone] option 4 or send us a message through MyChart.   We are unable to tell what your co-pay for medications will be in advance as this is different depending on your insurance coverage. However, we may be able to find a substitute medication at lower cost or fill out paperwork to get insurance to cover a needed medication.   If a prior authorization is required to get your medication covered by your insurance company, please allow us 1-2 business days to complete this process.  Drug prices often vary depending on where the prescription is filled and some pharmacies may offer cheaper prices.  The website www.goodrx.com contains coupons for medications through different pharmacies. The prices here do not account for what the cost may be with help from insurance (it may be cheaper with your insurance), but the website can give you the price if you did not use any insurance.  - You can print the associated coupon and take it with your prescription to the pharmacy.  - You may also stop by our office during regular business hours and pick up a GoodRx coupon card.  - If you need your prescription sent electronically to a different pharmacy, notify our office through [facility] MyChart or by phone at [phone] option 4.     Si Usted Necesita Algo Despus de Su Visita  Tambin puede enviarnos un mensaje a travs de MyChart. Por lo general respondemos a los mensajes de MyChart en el transcurso de 1 a 2 das hbiles.  Para renovar recetas, por favor pida a su farmacia que se ponga en contacto con nuestra oficina. Nuestro nmero de fax es el [phone].  Si tiene un asunto urgente cuando la clnica est cerrada y que no puede esperar hasta el siguiente da hbil, puede llamar/localizar a su doctor(a) al nmero que aparece a continuacin.   Por favor, tenga en cuenta que aunque hacemos todo lo posible para estar disponibles para asuntos urgentes fuera del horario de oficina, no estamos  disponibles las 24 horas del da, los 7 das de la semana.   Si tiene un problema urgente y no puede comunicarse con nosotros, puede optar por buscar atencin mdica  en el consultorio de su doctor(a), en una clnica privada, en un centro de atencin urgente o en una sala de emergencias.  Si tiene una emergencia mdica, por favor llame inmediatamente al 911 o vaya a la sala de emergencias.  Nmeros de bper  - Dr. Kowalski: [phone]  - Dra. Moye: [phone]  - Dra. Stewart: [phone]  En caso de inclemencias del tiempo, por favor llame a nuestra lnea principal al [phone] para una actualizacin sobre el estado de cualquier retraso o cierre.  Consejos para la medicacin en dermatologa: Por favor, guarde las cajas en las que vienen los medicamentos de uso tpico para ayudarle a seguir las instrucciones sobre dnde y cmo usarlos. Las farmacias generalmente imprimen las instrucciones del medicamento slo en las cajas y   no directamente en los tubos del medicamento.   Si su medicamento es muy caro, por favor, pngase en contacto con nuestra oficina llamando al [phone] y presione la opcin 4 o envenos un mensaje a travs de MyChart.   No podemos decirle cul ser su copago por los medicamentos por adelantado ya que esto es diferente dependiendo de la cobertura de su seguro. Sin embargo, es posible que podamos encontrar un medicamento sustituto a menor costo o llenar un formulario para que el seguro cubra el medicamento que se considera necesario.   Si se requiere una autorizacin previa para que su compaa de seguros cubra su medicamento, por favor permtanos de 1 a 2 das hbiles para completar este proceso.  Los precios de los medicamentos varan con frecuencia dependiendo del lugar de dnde se surte la receta y alguna farmacias pueden ofrecer precios ms baratos.  El sitio web www.goodrx.com tiene cupones para medicamentos de diferentes farmacias. Los precios aqu no  tienen en cuenta lo que podra costar con la ayuda del seguro (puede ser ms barato con su seguro), pero el sitio web puede darle el precio si no utiliz ningn seguro.  - Puede imprimir el cupn correspondiente y llevarlo con su receta a la farmacia.  - Tambin puede pasar por nuestra oficina durante el horario de atencin regular y recoger una tarjeta de cupones de GoodRx.  - Si necesita que su receta se enve electrnicamente a una farmacia diferente, informe a nuestra oficina a travs de MyChart de [facility] o por telfono llamando al [phone] y presione la opcin 4.  

## 2021-12-03 DIAGNOSIS — M25672 Stiffness of left ankle, not elsewhere classified: Secondary | ICD-10-CM | POA: Diagnosis not present

## 2021-12-03 DIAGNOSIS — M25572 Pain in left ankle and joints of left foot: Secondary | ICD-10-CM | POA: Diagnosis not present

## 2021-12-03 DIAGNOSIS — M722 Plantar fascial fibromatosis: Secondary | ICD-10-CM | POA: Diagnosis not present

## 2021-12-11 DIAGNOSIS — M25572 Pain in left ankle and joints of left foot: Secondary | ICD-10-CM | POA: Diagnosis not present

## 2021-12-11 DIAGNOSIS — M25672 Stiffness of left ankle, not elsewhere classified: Secondary | ICD-10-CM | POA: Diagnosis not present

## 2021-12-11 DIAGNOSIS — M722 Plantar fascial fibromatosis: Secondary | ICD-10-CM | POA: Diagnosis not present

## 2022-02-13 ENCOUNTER — Telehealth: Payer: Self-pay | Admitting: *Deleted

## 2022-02-13 DIAGNOSIS — R5382 Chronic fatigue, unspecified: Secondary | ICD-10-CM

## 2022-02-13 DIAGNOSIS — R7309 Other abnormal glucose: Secondary | ICD-10-CM

## 2022-02-13 DIAGNOSIS — Z0000 Encounter for general adult medical examination without abnormal findings: Secondary | ICD-10-CM

## 2022-02-13 NOTE — Telephone Encounter (Signed)
Pt scheduled her CPE and is having labs on 03/07/22 and sent a message to the schedulers saying:  Hello, for my lab work, I'd like to request my iron, b12, and vitamin d also be tested. I believe this is an additional test than the normal requested panel. Will you please ask Dr. Glori Bickers to include this? Thanks, Rebecca Peters

## 2022-02-13 NOTE — Telephone Encounter (Signed)
If insurance does not cover them will she be ok with paying out of pocket? (Let her know that is possible)  Thanks

## 2022-02-14 NOTE — Telephone Encounter (Signed)
Pt said her insurance will pay for the labs she checked but even if they do not pay she is okay paying out of pocket for the labs. Please order them

## 2022-03-07 ENCOUNTER — Other Ambulatory Visit (INDEPENDENT_AMBULATORY_CARE_PROVIDER_SITE_OTHER): Payer: BC Managed Care – PPO

## 2022-03-07 DIAGNOSIS — R7309 Other abnormal glucose: Secondary | ICD-10-CM

## 2022-03-07 DIAGNOSIS — R5382 Chronic fatigue, unspecified: Secondary | ICD-10-CM

## 2022-03-07 DIAGNOSIS — Z0000 Encounter for general adult medical examination without abnormal findings: Secondary | ICD-10-CM

## 2022-03-07 LAB — COMPREHENSIVE METABOLIC PANEL
ALT: 9 U/L (ref 0–35)
AST: 14 U/L (ref 0–37)
Albumin: 4.3 g/dL (ref 3.5–5.2)
Alkaline Phosphatase: 70 U/L (ref 39–117)
BUN: 17 mg/dL (ref 6–23)
CO2: 27 mEq/L (ref 19–32)
Calcium: 9.2 mg/dL (ref 8.4–10.5)
Chloride: 104 mEq/L (ref 96–112)
Creatinine, Ser: 0.88 mg/dL (ref 0.40–1.20)
GFR: 78.02 mL/min (ref >60.00)
Glucose, Bld: 95 mg/dL (ref 70–99)
Potassium: 4.1 mEq/L (ref 3.5–5.1)
Sodium: 138 mEq/L (ref 135–145)
Total Bilirubin: 0.6 mg/dL (ref 0.2–1.2)
Total Protein: 7.2 g/dL (ref 6.0–8.3)

## 2022-03-07 LAB — LIPID PANEL
Cholesterol: 153 mg/dL (ref 0–200)
HDL: 43.4 mg/dL (ref >39.00)
LDL Cholesterol: 85 mg/dL (ref 0–99)
NonHDL: 109.41
Total CHOL/HDL Ratio: 4
Triglycerides: 124 mg/dL (ref 0.0–149.0)
VLDL: 24.8 mg/dL (ref 0.0–40.0)

## 2022-03-07 LAB — VITAMIN D 25 HYDROXY (VIT D DEFICIENCY, FRACTURES): VITD: 30.35 ng/mL (ref 30.00–100.00)

## 2022-03-07 LAB — CBC WITH DIFFERENTIAL/PLATELET
Basophils Absolute: 0.1 10*3/uL (ref 0.0–0.1)
Basophils Relative: 1.4 % (ref 0.0–3.0)
Eosinophils Absolute: 0.5 10*3/uL (ref 0.0–0.7)
Eosinophils Relative: 7.1 % — ABNORMAL HIGH (ref 0.0–5.0)
HCT: 40.5 % (ref 36.0–46.0)
Hemoglobin: 13.5 g/dL (ref 12.0–15.0)
Lymphocytes Relative: 27.4 % (ref 12.0–46.0)
Lymphs Abs: 1.9 10*3/uL (ref 0.7–4.0)
MCHC: 33.2 g/dL (ref 30.0–36.0)
MCV: 92.4 fl (ref 78.0–100.0)
Monocytes Absolute: 0.5 10*3/uL (ref 0.1–1.0)
Monocytes Relative: 7.5 % (ref 3.0–12.0)
Neutro Abs: 3.9 10*3/uL (ref 1.4–7.7)
Neutrophils Relative %: 56.6 % (ref 43.0–77.0)
Platelets: 245 10*3/uL (ref 150.0–400.0)
RBC: 4.39 Mil/uL (ref 3.87–5.11)
RDW: 12.6 % (ref 11.5–15.5)
WBC: 6.9 10*3/uL (ref 4.0–10.5)

## 2022-03-07 LAB — TSH: TSH: 2.23 u[IU]/mL (ref 0.35–5.50)

## 2022-03-07 LAB — VITAMIN B12: Vitamin B-12: 551 pg/mL (ref 211–911)

## 2022-03-07 LAB — HEMOGLOBIN A1C: Hgb A1c MFr Bld: 5.3 % (ref 4.6–6.5)

## 2022-03-07 LAB — IRON: Iron: 154 ug/dL — ABNORMAL HIGH (ref 42–145)

## 2022-03-14 ENCOUNTER — Ambulatory Visit (INDEPENDENT_AMBULATORY_CARE_PROVIDER_SITE_OTHER): Payer: BC Managed Care – PPO | Admitting: Family Medicine

## 2022-03-14 ENCOUNTER — Encounter: Payer: Self-pay | Admitting: Family Medicine

## 2022-03-14 VITALS — BP 102/70 | HR 74 | Temp 97.7°F | Ht 61.75 in | Wt 158.5 lb

## 2022-03-14 DIAGNOSIS — R5382 Chronic fatigue, unspecified: Secondary | ICD-10-CM | POA: Diagnosis not present

## 2022-03-14 DIAGNOSIS — Z23 Encounter for immunization: Secondary | ICD-10-CM

## 2022-03-14 DIAGNOSIS — R7309 Other abnormal glucose: Secondary | ICD-10-CM | POA: Diagnosis not present

## 2022-03-14 DIAGNOSIS — Z1211 Encounter for screening for malignant neoplasm of colon: Secondary | ICD-10-CM | POA: Diagnosis not present

## 2022-03-14 DIAGNOSIS — Z0000 Encounter for general adult medical examination without abnormal findings: Secondary | ICD-10-CM

## 2022-03-14 NOTE — Progress Notes (Signed)
Subjective:    Patient ID: Rebecca Peters, female    DOB: 25-Mar-1976, 46 y.o.   MRN: [id number]  HPI Here for health maintenance exam and to review chronic medical problems    Wt Readings from Last 3 Encounters:  03/14/22 158 lb 8 oz (71.9 kg)  03/12/21 153 lb (69.4 kg)  03/09/18 147 lb (66.7 kg)   29.23 kg/m  Doing well  Working a lot   May plan a trip on December   Is taking care of herself  Wt is creeping up  Exercises -hiking / some mt biking  [place] with her dogs 1.25 miles  Was lifting weights -has all the equip to do it and really likes it  Hard to make time     Immunization History  Administered Date(s) Administered   Influenza Split 04/07/2012   Influenza,inj,Quad PF,6+ Mos 03/17/2013, 03/30/2014, 04/05/2015, 04/23/2017, 03/09/2018, 03/12/2021   Td 05/17/2010, 03/12/2021   Health Maintenance Due  Topic Date Due   COVID-19 Vaccine (1) Never done   HIV Screening  Never done   Hepatitis C Screening  Never done   COLONOSCOPY (Pts 45-35yr Insurance coverage will need to be confirmed)  Never done   PAP SMEAR-Modifier  05/22/2019   INFLUENZA VACCINE  12/18/2021   Flu shot today   Colon cancer screening : never got the ifob kit  Interested in cologuard    Pap 06/28/19 at gyn neg with neg hpv screen  [facility]   Periods are regular  No hot flashes  Wakes up a little sweaty at times  No need for contraception    Mammogram 06/2021 Self breast exam : no lumps   Derm visit 03/2021  Went back in July and froze a spot on her forehead  Does wear sunscreen    BP Readings from Last 3 Encounters:  03/14/22 102/70  03/12/21 106/70  08/13/20 124/76   Pulse Readings from Last 3 Encounters:  03/14/22 74  03/12/21 75  08/13/20 96    Glucose Lab Results  Component Value Date   HGBA1C 5.3 03/07/2022  Eats pretty well overall   Does not snack  Does like chocolate   Lab Results  Component Value Date   CREATININE  0.88 03/07/2022   BUN 17 03/07/2022   NA 138 03/07/2022   K 4.1 03/07/2022   CL 104 03/07/2022   CO2 27 03/07/2022   Cholesterol Lab Results  Component Value Date   CHOL 153 03/07/2022   CHOL 182 03/12/2021   CHOL 155 03/03/2018   Lab Results  Component Value Date   HDL 43.40 03/07/2022   HDL 52.90 03/12/2021   HDL 54.40 03/03/2018   Lab Results  Component Value Date   LDLCALC 85 03/07/2022   LDLCALC 108 (H) 03/12/2021   LDLCALC 82 03/03/2018   Lab Results  Component Value Date   TRIG 124.0 03/07/2022   TRIG 102.0 03/12/2021   TRIG 94.0 03/03/2018   Lab Results  Component Value Date   CHOLHDL 4 03/07/2022   CHOLHDL 3 03/12/2021   CHOLHDL 3 03/03/2018   No results found for: "LDLDIRECT"  HDL is down  LDL is down as well  Eats 1 burger per week    Lab Results  Component Value Date   WBC 6.9 03/07/2022   HGB 13.5 03/07/2022   HCT 40.5 03/07/2022   MCV 92.4 03/07/2022   PLT 245.0 03/07/2022  Iron high at 154  She was anemic  when her daughter was born    D level of 30.35  Does not take vitamin D right now   Lab Results  Component Value Date   RCVELFYB01 751 03/07/2022    Lab Results  Component Value Date   TSH 2.23 03/07/2022   Lab Results  Component Value Date   ALT 9 03/07/2022   AST 14 03/07/2022   ALKPHOS 70 03/07/2022   BILITOT 0.6 03/07/2022   Had chronic pain in R quad muscle  R hip is tight  Did PT for this   Patient Active Problem List   Diagnosis Date Noted   Iron excess 03/15/2022   Encounter for screening mammogram for breast cancer 04/23/2021   Fatigue 03/13/2021   Colon cancer screening 03/12/2021   Lump of skin 03/12/2021   Fibrocystic disease of both breasts 06/20/2014   Lump in the abdomen 06/20/2014   Routine general medical examination at a health care facility 03/24/2011   Screening for lipoid disorders 03/22/2011   Elevated glucose level 03/22/2011   H/O abnormal cervical Papanicolaou smear 05/17/2010    COCCYGEAL PAIN 05/15/2010   SKIN CANCER, HX OF 05/15/2010   Past Medical History:  Diagnosis Date   Cancer ([place])    melanoma   Dysplasia of cervix, unspecified    Dysplastic nevus 05/10/2010   Left mid back. Mild atypia. Close to margin.   Dysplastic nevus 02/22/2014   Right central upper abdomen. Mild atypia. Limited margins free.   Dysplastic nevus 02/22/2014   Left flank. Moderate atypia. Lateral margin involved.   Dysplastic nevus 01/16/2015   Right spinal upper back. Mild atypia. Limited margins free.    Dysplastic nevus 02/04/2017   Right medial breast. Mild atypia. Deep margin focally involved.    Dysplastic nevus 02/04/2017   Left upper back. Mild atypia. Limited margins free.    Late menses    long cycle---menses every 6 weeks   Melanoma ([place]) 02/08/2010   Right lower leg. Compound melanocytic proliferation with severe atypia, best interpreted as Melanoma. Clarkes level: IV. Breslow's 1.1mm, excised at [facility] history of other malignant neoplasm of skin    Past Surgical History:  Procedure Laterality Date   APPENDECTOMY  5/10   COLPOSCOPY  12/11   MELANOMA EXCISION  10/11   lymphnodes stage 2; wide excision with 2 LN removed   Social History   Tobacco Use   Smoking status: Never   Smokeless tobacco: Never  Substance Use Topics   Alcohol use: Yes    Alcohol/week: 0.0 standard drinks of alcohol    Comment: 1 drink every few months   Drug use: No   Family History  Problem Relation Age of Onset   Hyperlipidemia Father    Hypertension Father    Hyperlipidemia Mother    Breast cancer Maternal Grandmother    Cancer Neg Hx    Allergies  Allergen Reactions   Metoclopramide Hcl     REACTION: trouble breathing and muscle spasms to face   No current outpatient medications on file prior to visit.   No current facility-administered medications on file prior to visit.    Review of Systems  Constitutional:  Positive for fatigue. Negative for activity  change, appetite change, fever and unexpected weight change.  HENT:  Negative for congestion, ear pain, rhinorrhea, sinus pressure and sore throat.   Eyes:  Negative for pain, redness and visual disturbance.  Respiratory:  Negative for cough, shortness of breath and wheezing.   Cardiovascular:  Negative for  chest pain and palpitations.  Gastrointestinal:  Negative for abdominal pain, blood in stool, constipation and diarrhea.  Endocrine: Negative for polydipsia and polyuria.  Genitourinary:  Negative for dysuria, frequency and urgency.  Musculoskeletal:  Negative for arthralgias, back pain and myalgias.       Some hip problems  Skin:  Negative for pallor and rash.  Allergic/Immunologic: Negative for environmental allergies.  Neurological:  Negative for dizziness, syncope and headaches.  Hematological:  Negative for adenopathy. Does not bruise/bleed easily.  Psychiatric/Behavioral:  Negative for decreased concentration and dysphoric mood. The patient is not nervous/anxious.        Objective:   Physical Exam Constitutional:      General: She is not in acute distress.    Appearance: Normal appearance. She is well-developed. She is not ill-appearing or diaphoretic.     Comments: overwt  HENT:     Head: Normocephalic and atraumatic.     Right Ear: Tympanic membrane, ear canal and external ear normal.     Left Ear: Tympanic membrane, ear canal and external ear normal.     Nose: Nose normal. No congestion.     Mouth/Throat:     Mouth: Mucous membranes are moist.     Pharynx: Oropharynx is clear. No posterior oropharyngeal erythema.  Eyes:     General: No scleral icterus.    Extraocular Movements: Extraocular movements intact.     Conjunctiva/sclera: Conjunctivae normal.     Pupils: Pupils are equal, round, and reactive to light.  Neck:     Thyroid: No thyromegaly.     Vascular: No carotid bruit or JVD.  Cardiovascular:     Rate and Rhythm: Normal rate and regular rhythm.      Pulses: Normal pulses.     Heart sounds: Normal heart sounds.     No gallop.  Pulmonary:     Effort: Pulmonary effort is normal. No respiratory distress.     Breath sounds: Normal breath sounds. No wheezing.     Comments: Good air exch Chest:     Chest wall: No tenderness.  Abdominal:     General: Bowel sounds are normal. There is no distension or abdominal bruit.     Palpations: Abdomen is soft. There is no mass.     Tenderness: There is no abdominal tenderness.     Hernia: No hernia is present.  Genitourinary:    Comments: Breast exam: No mass, nodules, thickening, tenderness, bulging, retraction, inflamation, nipple discharge or skin changes noted.  No axillary or clavicular LA.     Musculoskeletal:        General: No tenderness. Normal range of motion.     Cervical back: Normal range of motion and neck supple. No rigidity. No muscular tenderness.     Right lower leg: No edema.     Left lower leg: No edema.     Comments: No kyphosis   Lymphadenopathy:     Cervical: No cervical adenopathy.  Skin:    General: Skin is warm and dry.     Coloration: Skin is not pale.     Findings: No erythema or rash.     Comments: Solar lentigines diffusely   Neurological:     Mental Status: She is alert. Mental status is at baseline.     Cranial Nerves: No cranial nerve deficit.     Motor: No abnormal muscle tone.     Coordination: Coordination normal.     Gait: Gait normal.     Deep Tendon Reflexes: Reflexes  are normal and symmetric. Reflexes normal.  Psychiatric:        Mood and Affect: Mood normal.        Cognition and Memory: Cognition and memory normal.           Assessment & Plan:   Problem List Items Addressed This Visit       Other   Colon cancer screening    Pt is not ready for colonoscopy at this time cologuard screen ordered        Relevant Orders   Cologuard   Elevated glucose level   Fatigue    Labs look ok overall Iron is slt high for ? Reason  Will re  check this with ferritin      Iron excess    Iron level is slt high at 154 No supplements with iron  Will plan to re check this with ferritin       Routine general medical examination at a health care facility - Primary    Reviewed health habits including diet and exercise and skin cancer prevention Reviewed appropriate screening tests for age  Also reviewed health mt list, fam hx and immunization status , as well as social and family history   See HPI Labs reviewed  Flu shot given  cologuard kit ordered  Pap utd 2021 Mammogram utd 06/2021 utd derm care and uses sun protection       Other Visit Diagnoses     Need for influenza vaccination       Relevant Orders   Flu Vaccine QUAD 6+ mos PF IM (Fluarix Quad PF) (Completed)

## 2022-03-14 NOTE — Patient Instructions (Addendum)
Get back to strength training (in addition to your walking/cardio)   I ordered the cologuard kit  If no one reaches out in 1-2 weeks let us know   Use sun protection when you are out   Try to get most of your carbohydrates from produce (with the exception of white potatoes)  Eat less bread/pasta/rice/snack foods/cereals/sweets and other items from the middle of the grocery store (processed carbs)  Let's re check iron level in 1-2 months  Make sure you don't take any vitamins with iron   Take vitamin D3    2000 iu daily   Take care of yourself

## 2022-03-15 NOTE — Assessment & Plan Note (Signed)
Labs look ok overall Iron is slt high for ? Reason  Will re check this with ferritin

## 2022-03-15 NOTE — Assessment & Plan Note (Signed)
Pt is not ready for colonoscopy at this time cologuard screen ordered

## 2022-03-15 NOTE — Assessment & Plan Note (Signed)
Reviewed health habits including diet and exercise and skin cancer prevention Reviewed appropriate screening tests for age  Also reviewed health mt list, fam hx and immunization status , as well as social and family history   See HPI Labs reviewed  Flu shot given  cologuard kit ordered  Pap utd 2021 Mammogram utd 06/2021 utd derm care and uses sun protection

## 2022-03-15 NOTE — Assessment & Plan Note (Signed)
Iron level is slt high at 154 No supplements with iron  Will plan to re check this with ferritin

## 2022-03-31 DIAGNOSIS — Z1211 Encounter for screening for malignant neoplasm of colon: Secondary | ICD-10-CM | POA: Diagnosis not present

## 2022-04-10 LAB — COLOGUARD: COLOGUARD: NEGATIVE

## 2022-04-18 ENCOUNTER — Other Ambulatory Visit (INDEPENDENT_AMBULATORY_CARE_PROVIDER_SITE_OTHER): Payer: BC Managed Care – PPO

## 2022-04-18 DIAGNOSIS — R5382 Chronic fatigue, unspecified: Secondary | ICD-10-CM | POA: Diagnosis not present

## 2022-04-19 LAB — IRON,TIBC AND FERRITIN PANEL
%SAT: 31 % (calc) (ref 16–45)
Ferritin: 42 ng/mL (ref 16–232)
Iron: 66 ug/dL (ref 40–190)
TIBC: 212 mcg/dL (calc) — ABNORMAL LOW (ref 250–450)

## 2022-07-18 ENCOUNTER — Other Ambulatory Visit: Payer: Self-pay | Admitting: Family Medicine

## 2022-07-18 DIAGNOSIS — Z1231 Encounter for screening mammogram for malignant neoplasm of breast: Secondary | ICD-10-CM

## 2022-08-08 ENCOUNTER — Ambulatory Visit
Admission: RE | Admit: 2022-08-08 | Discharge: 2022-08-08 | Disposition: A | Discharge status: Home / Self Care | Payer: BC Managed Care – PPO | Source: Ambulatory Visit | Attending: Family Medicine | Admitting: Family Medicine

## 2022-08-08 DIAGNOSIS — Z1231 Encounter for screening mammogram for malignant neoplasm of breast: Secondary | ICD-10-CM | POA: Diagnosis not present

## 2022-09-09 DIAGNOSIS — M9901 Segmental and somatic dysfunction of cervical region: Secondary | ICD-10-CM | POA: Diagnosis not present

## 2022-09-09 DIAGNOSIS — M9903 Segmental and somatic dysfunction of lumbar region: Secondary | ICD-10-CM | POA: Diagnosis not present

## 2022-09-09 DIAGNOSIS — M9905 Segmental and somatic dysfunction of pelvic region: Secondary | ICD-10-CM | POA: Diagnosis not present

## 2022-09-09 DIAGNOSIS — M9902 Segmental and somatic dysfunction of thoracic region: Secondary | ICD-10-CM | POA: Diagnosis not present

## 2022-09-10 ENCOUNTER — Ambulatory Visit: Payer: BC Managed Care – PPO | Admitting: Primary Care

## 2022-09-10 DIAGNOSIS — M9903 Segmental and somatic dysfunction of lumbar region: Secondary | ICD-10-CM | POA: Diagnosis not present

## 2022-09-10 DIAGNOSIS — M9905 Segmental and somatic dysfunction of pelvic region: Secondary | ICD-10-CM | POA: Diagnosis not present

## 2022-09-10 DIAGNOSIS — M9907 Segmental and somatic dysfunction of upper extremity: Secondary | ICD-10-CM | POA: Diagnosis not present

## 2022-09-10 DIAGNOSIS — M9901 Segmental and somatic dysfunction of cervical region: Secondary | ICD-10-CM | POA: Diagnosis not present

## 2022-09-10 DIAGNOSIS — M9902 Segmental and somatic dysfunction of thoracic region: Secondary | ICD-10-CM | POA: Diagnosis not present

## 2022-09-11 ENCOUNTER — Encounter: Payer: Self-pay | Admitting: Primary Care

## 2022-09-11 ENCOUNTER — Ambulatory Visit (INDEPENDENT_AMBULATORY_CARE_PROVIDER_SITE_OTHER): Payer: BC Managed Care – PPO | Admitting: Primary Care

## 2022-09-11 VITALS — BP 136/84 | HR 87 | Temp 97.8°F | Ht 61.75 in | Wt 162.0 lb

## 2022-09-11 DIAGNOSIS — R42 Dizziness and giddiness: Secondary | ICD-10-CM | POA: Diagnosis not present

## 2022-09-11 DIAGNOSIS — R519 Headache, unspecified: Secondary | ICD-10-CM | POA: Diagnosis not present

## 2022-09-11 LAB — COMPREHENSIVE METABOLIC PANEL
ALT: 9 U/L (ref 0–35)
AST: 16 U/L (ref 0–37)
Albumin: 4.2 g/dL (ref 3.5–5.2)
Alkaline Phosphatase: 65 U/L (ref 39–117)
BUN: 17 mg/dL (ref 6–23)
CO2: 27 mEq/L (ref 19–32)
Calcium: 9.3 mg/dL (ref 8.4–10.5)
Chloride: 104 mEq/L (ref 96–112)
Creatinine, Ser: 0.8 mg/dL (ref 0.40–1.20)
GFR: 87.16 mL/min (ref >60.00)
Glucose, Bld: 89 mg/dL (ref 70–99)
Potassium: 4.2 mEq/L (ref 3.5–5.1)
Sodium: 140 mEq/L (ref 135–145)
Total Bilirubin: 0.5 mg/dL (ref 0.2–1.2)
Total Protein: 6.9 g/dL (ref 6.0–8.3)

## 2022-09-11 LAB — CBC
HCT: 41 % (ref 36.0–46.0)
Hemoglobin: 13.9 g/dL (ref 12.0–15.0)
MCHC: 33.9 g/dL (ref 30.0–36.0)
MCV: 92.4 fl (ref 78.0–100.0)
Platelets: 265 10*3/uL (ref 150.0–400.0)
RBC: 4.44 Mil/uL (ref 3.87–5.11)
RDW: 12.8 % (ref 11.5–15.5)
WBC: 7.2 10*3/uL (ref 4.0–10.5)

## 2022-09-11 LAB — SEDIMENTATION RATE: Sed Rate: 14 mm/hr (ref 0–20)

## 2022-09-11 LAB — C-REACTIVE PROTEIN: CRP: <1 mg/dL (ref 0.5–20.0)

## 2022-09-11 NOTE — Progress Notes (Addendum)
Subjective:    Patient ID: Rebecca Peters, female    DOB: 10-18-1975, 47 y.o.   MRN: [id number]  Dizziness Associated symptoms include congestion and headaches. Pertinent negatives include no fever, neck pain, numbness or sore throat.    Rebecca Peters is a very pleasant 47 y.o. female patient of Dr. Milinda Antis with a history of coccygeal pain, fatigue, iron excess who presents today to discuss dizziness.  Symptom onset one week ago with "room spinning" sensation when changing positions, especially from laying to sitting and sitting to standing. She can see the room "waving" around her. She's also experienced a return in her chronic right ear fullness.   Three days ago she noticed a sharp "stinging pain" to the right lateral occipital region of her head. The sharp pain continued every few minutes so she took 600 mg of Ibuprofen with improvement. She was able to go about her day per usual. Later that evening she noticed the pain return to the right temporal region so she took another 600 mg of Ibuprofen with temporary improvement. Sunday evening she was unable to sleep due to her pain. Sunday afternoon she noticed tenderness to the right temporal region with brushing her hair.   She saw her chiropractor two mornings ago who completed a few adjustments. She took ibuprofen 800 mg and slept all day. That evening her right sided pain returned, took another 800 mg of Ibuprofen with improvement.   Yesterday she noticed her right sided "zapping" pain move from the right lateral occipital region to the right temporal region. She returned to her chiropractor yesterday who completed some adjustments. Her last dose of Ibuprofen 800 mg was last night around 8 pm. She was able to complete her exercises last night.   Today she has not noticed her right "zapping" pain to the temporal region. She was able to brush her hair this morning without pain.   She denies a history of migraines, recent double  vision, nausea or vomiting, recent neck pain, fevers, numbness/tingling. She continues to notice mild symptoms of her dizziness spells with positional changes.    Review of Systems  Constitutional:  Negative for fever.  HENT:  Positive for congestion. Negative for ear pain, facial swelling and sore throat.        Right ear fullness  Eyes:  Positive for visual disturbance.  Musculoskeletal:  Negative for back pain and neck pain.  Neurological:  Positive for dizziness, light-headedness and headaches. Negative for numbness.         Past Medical History:  Diagnosis Date   Cancer    melanoma   Dysplasia of cervix, unspecified    Dysplastic nevus 05/10/2010   Left mid back. Mild atypia. Close to margin.   Dysplastic nevus 02/22/2014   Right central upper abdomen. Mild atypia. Limited margins free.   Dysplastic nevus 02/22/2014   Left flank. Moderate atypia. Lateral margin involved.   Dysplastic nevus 01/16/2015   Right spinal upper back. Mild atypia. Limited margins free.    Dysplastic nevus 02/04/2017   Right medial breast. Mild atypia. Deep margin focally involved.    Dysplastic nevus 02/04/2017   Left upper back. Mild atypia. Limited margins free.    Late menses    long cycle---menses every 6 weeks   Melanoma 02/08/2010   Right lower leg. Compound melanocytic proliferation with severe atypia, best interpreted as Melanoma. Clarkes level: IV. Breslow's 1.82mm, excised at [facility]   Personal history of other malignant neoplasm of skin  Social History   Socioeconomic History   Marital status: Married    Spouse name: Not on file   Number of children: 1   Years of education: Not on file   Highest education level: Not on file  Occupational History   Occupation: Government social research officer  Tobacco Use   Smoking status: Never   Smokeless tobacco: Never  Substance and Sexual Activity   Alcohol use: Yes    Alcohol/week: 0.0 standard drinks of alcohol    Comment: 1 drink every few months    Drug use: No   Sexual activity: Not on file  Other Topics Concern   Not on file  Social History Narrative   Government social research officer      1 daughter      Married      Regular exercise-runs   Social Determinants of Health   Financial Resource Strain: Not on file  Food Insecurity: Not on file  Transportation Needs: Not on file  Physical Activity: Not on file  Stress: Not on file  Social Connections: Not on file  Intimate Partner Violence: Not on file    Past Surgical History:  Procedure Laterality Date   APPENDECTOMY  5/10   COLPOSCOPY  12/11   MELANOMA EXCISION  10/11   lymphnodes stage 2; wide excision with 2 LN removed    Family History  Problem Relation Age of Onset   Hyperlipidemia Father    Hypertension Father    Hyperlipidemia Mother    Breast cancer Maternal Grandmother    Cancer Neg Hx     Allergies  Allergen Reactions   Metoclopramide Hcl     REACTION: trouble breathing and muscle spasms to face    No current outpatient medications on file prior to visit.   No current facility-administered medications on file prior to visit.    BP 136/84   Pulse 87   Temp 97.8 F (36.6 C) (Temporal)   Ht 5' 1.75" (1.568 m)   Wt 162 lb (73.5 kg)   SpO2 99%   BMI 29.87 kg/m  Objective:   Physical Exam HENT:     Right Ear: Tympanic membrane and ear canal normal. There is no impacted cerumen.     Left Ear: Tympanic membrane and ear canal normal. There is no impacted cerumen.     Ears:     Comments: Slight fluid noted to right TM Eyes:     Extraocular Movements: Extraocular movements intact.  Cardiovascular:     Rate and Rhythm: Normal rate and regular rhythm.  Pulmonary:     Effort: Pulmonary effort is normal.     Breath sounds: Normal breath sounds.  Musculoskeletal:     Cervical back: Neck supple.  Skin:    General: Skin is warm and dry.     Findings: No erythema or rash.  Neurological:     Mental Status: She is oriented to person, place, and time.      Cranial Nerves: No cranial nerve deficit.     Coordination: Coordination normal.     Comments: No tenderness to right temporal region            Assessment & Plan:  Temporal pain Assessment & Plan: Need to rule out GCA. Doesn't seem to be migraine in etiology. Low suspicion for bells palsy.  Checking labs today including Sed Rate, CRP, CMP, CBC, serum protein electrophoresis.   Discuss treatment options. NSAID treatment has been effective thus far.  She declines prednisone course.   Continue Ibuprofen 800  mg TID PRN. Add Flonase BID.   Await results.   Orders: -     Protein electrophoresis, serum -     CBC -     Comprehensive metabolic panel -     C-reactive protein -     Sedimentation rate  Dizziness Assessment & Plan: More consistent with vertigo which could be secondary to her inner ear symptoms. Start Flonase (fluticasone) nasal spray. Instill 1 spray in each nostril twice daily.  Consider Meclizine.  She will update.         Doreene Nest, NP

## 2022-09-11 NOTE — Assessment & Plan Note (Signed)
More consistent with vertigo which could be secondary to her inner ear symptoms. Start Flonase (fluticasone) nasal spray. Instill 1 spray in each nostril twice daily.  Consider Meclizine.  She will update.

## 2022-09-11 NOTE — Assessment & Plan Note (Signed)
Need to rule out GCA. Doesn't seem to be migraine in etiology. Low suspicion for bells palsy.  Checking labs today including Sed Rate, CRP, CMP, CBC, serum protein electrophoresis.   Discuss treatment options. NSAID treatment has been effective thus far.  She declines prednisone course.   Continue Ibuprofen 800 mg TID PRN. Add Flonase BID.   Await results.

## 2022-09-11 NOTE — Patient Instructions (Signed)
Stop by the lab prior to leaving today. I will notify you of your results once received.   We need to rule out Giant Cell Arteritis.   It was a pleasure to see you today!

## 2022-09-12 DIAGNOSIS — M9903 Segmental and somatic dysfunction of lumbar region: Secondary | ICD-10-CM | POA: Diagnosis not present

## 2022-09-12 DIAGNOSIS — M9902 Segmental and somatic dysfunction of thoracic region: Secondary | ICD-10-CM | POA: Diagnosis not present

## 2022-09-12 DIAGNOSIS — M9905 Segmental and somatic dysfunction of pelvic region: Secondary | ICD-10-CM | POA: Diagnosis not present

## 2022-09-12 DIAGNOSIS — M9901 Segmental and somatic dysfunction of cervical region: Secondary | ICD-10-CM | POA: Diagnosis not present

## 2022-09-12 DIAGNOSIS — M9907 Segmental and somatic dysfunction of upper extremity: Secondary | ICD-10-CM | POA: Diagnosis not present

## 2022-09-14 LAB — PROTEIN ELECTROPHORESIS, SERUM
Albumin ELP: 4 g/dL (ref 3.8–4.8)
Alpha 1: 0.2 g/dL (ref 0.2–0.3)
Alpha 2: 0.6 g/dL (ref 0.5–0.9)
Beta 2: 0.3 g/dL (ref 0.2–0.5)
Beta Globulin: 0.4 g/dL (ref 0.4–0.6)
Gamma Globulin: 1.2 g/dL (ref 0.8–1.7)
Total Protein: 6.8 g/dL (ref 6.1–8.1)

## 2022-09-16 DIAGNOSIS — M9907 Segmental and somatic dysfunction of upper extremity: Secondary | ICD-10-CM | POA: Diagnosis not present

## 2022-09-16 DIAGNOSIS — M9902 Segmental and somatic dysfunction of thoracic region: Secondary | ICD-10-CM | POA: Diagnosis not present

## 2022-09-16 DIAGNOSIS — M9901 Segmental and somatic dysfunction of cervical region: Secondary | ICD-10-CM | POA: Diagnosis not present

## 2022-09-16 DIAGNOSIS — M9903 Segmental and somatic dysfunction of lumbar region: Secondary | ICD-10-CM | POA: Diagnosis not present

## 2022-09-16 DIAGNOSIS — M9905 Segmental and somatic dysfunction of pelvic region: Secondary | ICD-10-CM | POA: Diagnosis not present

## 2022-11-05 ENCOUNTER — Encounter: Payer: BC Managed Care – PPO | Admitting: Dermatology

## 2023-03-11 ENCOUNTER — Telehealth: Payer: Self-pay | Admitting: Family Medicine

## 2023-03-11 DIAGNOSIS — Z13220 Encounter for screening for lipoid disorders: Secondary | ICD-10-CM

## 2023-03-11 DIAGNOSIS — R7309 Other abnormal glucose: Secondary | ICD-10-CM

## 2023-03-11 DIAGNOSIS — Z0000 Encounter for general adult medical examination without abnormal findings: Secondary | ICD-10-CM

## 2023-03-11 DIAGNOSIS — R5382 Chronic fatigue, unspecified: Secondary | ICD-10-CM

## 2023-03-11 NOTE — Telephone Encounter (Signed)
For her physical we need to stick to the health mt stuff-I am not allowed to add extra or new issues unfortunately (for insurance purposes and also due to lack of time)    Let's do her physical/ that day if we have time we can discuss the symptoms she has that would necessitate the extra lab work (I will need to link each test up to a specific diagnosis) and then plan labs a different day for those   If she wants to hold off on all labs before her physical that is fine as well-then we can bundle them together later (her choice(

## 2023-03-11 NOTE — Telephone Encounter (Signed)
-----   Message from Vincenza Hews sent at 03/06/2023  1:40 PM EDT ----- Regarding: Labs Thur. 03/13/23 Hello,  Patient is coming in for CPE labs on Thursday 03/13/23. Can we get orders please.   Thanks

## 2023-03-13 ENCOUNTER — Other Ambulatory Visit: Payer: BC Managed Care – PPO

## 2023-03-20 ENCOUNTER — Encounter: Payer: BC Managed Care – PPO | Admitting: Family Medicine

## 2023-03-29 DIAGNOSIS — M9905 Segmental and somatic dysfunction of pelvic region: Secondary | ICD-10-CM | POA: Diagnosis not present

## 2023-03-29 DIAGNOSIS — M9901 Segmental and somatic dysfunction of cervical region: Secondary | ICD-10-CM | POA: Diagnosis not present

## 2023-03-29 DIAGNOSIS — M9902 Segmental and somatic dysfunction of thoracic region: Secondary | ICD-10-CM | POA: Diagnosis not present

## 2023-03-29 DIAGNOSIS — M9903 Segmental and somatic dysfunction of lumbar region: Secondary | ICD-10-CM | POA: Diagnosis not present

## 2023-03-31 DIAGNOSIS — M9902 Segmental and somatic dysfunction of thoracic region: Secondary | ICD-10-CM | POA: Diagnosis not present

## 2023-03-31 DIAGNOSIS — M9905 Segmental and somatic dysfunction of pelvic region: Secondary | ICD-10-CM | POA: Diagnosis not present

## 2023-03-31 DIAGNOSIS — M9901 Segmental and somatic dysfunction of cervical region: Secondary | ICD-10-CM | POA: Diagnosis not present

## 2023-03-31 DIAGNOSIS — M9903 Segmental and somatic dysfunction of lumbar region: Secondary | ICD-10-CM | POA: Diagnosis not present

## 2023-03-31 DIAGNOSIS — M9907 Segmental and somatic dysfunction of upper extremity: Secondary | ICD-10-CM | POA: Diagnosis not present

## 2023-04-03 DIAGNOSIS — M9907 Segmental and somatic dysfunction of upper extremity: Secondary | ICD-10-CM | POA: Diagnosis not present

## 2023-04-03 DIAGNOSIS — M9905 Segmental and somatic dysfunction of pelvic region: Secondary | ICD-10-CM | POA: Diagnosis not present

## 2023-04-03 DIAGNOSIS — M9901 Segmental and somatic dysfunction of cervical region: Secondary | ICD-10-CM | POA: Diagnosis not present

## 2023-04-03 DIAGNOSIS — M9902 Segmental and somatic dysfunction of thoracic region: Secondary | ICD-10-CM | POA: Diagnosis not present

## 2023-04-03 DIAGNOSIS — M9903 Segmental and somatic dysfunction of lumbar region: Secondary | ICD-10-CM | POA: Diagnosis not present

## 2023-04-08 DIAGNOSIS — M9905 Segmental and somatic dysfunction of pelvic region: Secondary | ICD-10-CM | POA: Diagnosis not present

## 2023-04-08 DIAGNOSIS — M9907 Segmental and somatic dysfunction of upper extremity: Secondary | ICD-10-CM | POA: Diagnosis not present

## 2023-04-08 DIAGNOSIS — M9901 Segmental and somatic dysfunction of cervical region: Secondary | ICD-10-CM | POA: Diagnosis not present

## 2023-04-08 DIAGNOSIS — M9903 Segmental and somatic dysfunction of lumbar region: Secondary | ICD-10-CM | POA: Diagnosis not present

## 2023-04-08 DIAGNOSIS — M9902 Segmental and somatic dysfunction of thoracic region: Secondary | ICD-10-CM | POA: Diagnosis not present

## 2023-04-10 DIAGNOSIS — M9901 Segmental and somatic dysfunction of cervical region: Secondary | ICD-10-CM | POA: Diagnosis not present

## 2023-04-10 DIAGNOSIS — M9902 Segmental and somatic dysfunction of thoracic region: Secondary | ICD-10-CM | POA: Diagnosis not present

## 2023-04-10 DIAGNOSIS — M9907 Segmental and somatic dysfunction of upper extremity: Secondary | ICD-10-CM | POA: Diagnosis not present

## 2023-04-10 DIAGNOSIS — M9905 Segmental and somatic dysfunction of pelvic region: Secondary | ICD-10-CM | POA: Diagnosis not present

## 2023-04-10 DIAGNOSIS — M9903 Segmental and somatic dysfunction of lumbar region: Secondary | ICD-10-CM | POA: Diagnosis not present

## 2023-04-16 DIAGNOSIS — M9907 Segmental and somatic dysfunction of upper extremity: Secondary | ICD-10-CM | POA: Diagnosis not present

## 2023-04-16 DIAGNOSIS — M9903 Segmental and somatic dysfunction of lumbar region: Secondary | ICD-10-CM | POA: Diagnosis not present

## 2023-04-16 DIAGNOSIS — M9905 Segmental and somatic dysfunction of pelvic region: Secondary | ICD-10-CM | POA: Diagnosis not present

## 2023-04-16 DIAGNOSIS — M9902 Segmental and somatic dysfunction of thoracic region: Secondary | ICD-10-CM | POA: Diagnosis not present

## 2023-04-16 DIAGNOSIS — M9901 Segmental and somatic dysfunction of cervical region: Secondary | ICD-10-CM | POA: Diagnosis not present

## 2023-04-18 DIAGNOSIS — M9903 Segmental and somatic dysfunction of lumbar region: Secondary | ICD-10-CM | POA: Diagnosis not present

## 2023-04-18 DIAGNOSIS — M9902 Segmental and somatic dysfunction of thoracic region: Secondary | ICD-10-CM | POA: Diagnosis not present

## 2023-04-18 DIAGNOSIS — M9901 Segmental and somatic dysfunction of cervical region: Secondary | ICD-10-CM | POA: Diagnosis not present

## 2023-04-18 DIAGNOSIS — M9907 Segmental and somatic dysfunction of upper extremity: Secondary | ICD-10-CM | POA: Diagnosis not present

## 2023-04-18 DIAGNOSIS — M9905 Segmental and somatic dysfunction of pelvic region: Secondary | ICD-10-CM | POA: Diagnosis not present

## 2023-04-21 ENCOUNTER — Encounter: Payer: BC Managed Care – PPO | Admitting: Family Medicine

## 2023-04-21 DIAGNOSIS — R5382 Chronic fatigue, unspecified: Secondary | ICD-10-CM

## 2023-04-21 DIAGNOSIS — M9907 Segmental and somatic dysfunction of upper extremity: Secondary | ICD-10-CM | POA: Diagnosis not present

## 2023-04-21 DIAGNOSIS — M9901 Segmental and somatic dysfunction of cervical region: Secondary | ICD-10-CM | POA: Diagnosis not present

## 2023-04-21 DIAGNOSIS — M9902 Segmental and somatic dysfunction of thoracic region: Secondary | ICD-10-CM | POA: Diagnosis not present

## 2023-04-21 DIAGNOSIS — M9903 Segmental and somatic dysfunction of lumbar region: Secondary | ICD-10-CM | POA: Diagnosis not present

## 2023-04-21 DIAGNOSIS — M9905 Segmental and somatic dysfunction of pelvic region: Secondary | ICD-10-CM | POA: Diagnosis not present

## 2023-04-21 DIAGNOSIS — R7309 Other abnormal glucose: Secondary | ICD-10-CM

## 2023-04-21 DIAGNOSIS — Z0000 Encounter for general adult medical examination without abnormal findings: Secondary | ICD-10-CM

## 2023-04-23 DIAGNOSIS — M9905 Segmental and somatic dysfunction of pelvic region: Secondary | ICD-10-CM | POA: Diagnosis not present

## 2023-04-23 DIAGNOSIS — M9901 Segmental and somatic dysfunction of cervical region: Secondary | ICD-10-CM | POA: Diagnosis not present

## 2023-04-23 DIAGNOSIS — M9907 Segmental and somatic dysfunction of upper extremity: Secondary | ICD-10-CM | POA: Diagnosis not present

## 2023-04-23 DIAGNOSIS — M9902 Segmental and somatic dysfunction of thoracic region: Secondary | ICD-10-CM | POA: Diagnosis not present

## 2023-04-23 DIAGNOSIS — M9903 Segmental and somatic dysfunction of lumbar region: Secondary | ICD-10-CM | POA: Diagnosis not present

## 2023-05-05 DIAGNOSIS — M9905 Segmental and somatic dysfunction of pelvic region: Secondary | ICD-10-CM | POA: Diagnosis not present

## 2023-05-05 DIAGNOSIS — M9903 Segmental and somatic dysfunction of lumbar region: Secondary | ICD-10-CM | POA: Diagnosis not present

## 2023-05-05 DIAGNOSIS — M9902 Segmental and somatic dysfunction of thoracic region: Secondary | ICD-10-CM | POA: Diagnosis not present

## 2023-05-05 DIAGNOSIS — M9907 Segmental and somatic dysfunction of upper extremity: Secondary | ICD-10-CM | POA: Diagnosis not present

## 2023-05-05 DIAGNOSIS — M9901 Segmental and somatic dysfunction of cervical region: Secondary | ICD-10-CM | POA: Diagnosis not present

## 2023-05-12 DIAGNOSIS — M9905 Segmental and somatic dysfunction of pelvic region: Secondary | ICD-10-CM | POA: Diagnosis not present

## 2023-05-12 DIAGNOSIS — M9907 Segmental and somatic dysfunction of upper extremity: Secondary | ICD-10-CM | POA: Diagnosis not present

## 2023-05-12 DIAGNOSIS — M9903 Segmental and somatic dysfunction of lumbar region: Secondary | ICD-10-CM | POA: Diagnosis not present

## 2023-05-12 DIAGNOSIS — M9902 Segmental and somatic dysfunction of thoracic region: Secondary | ICD-10-CM | POA: Diagnosis not present

## 2023-05-12 DIAGNOSIS — M9901 Segmental and somatic dysfunction of cervical region: Secondary | ICD-10-CM | POA: Diagnosis not present

## 2023-06-02 DIAGNOSIS — M9903 Segmental and somatic dysfunction of lumbar region: Secondary | ICD-10-CM | POA: Diagnosis not present

## 2023-06-02 DIAGNOSIS — M9902 Segmental and somatic dysfunction of thoracic region: Secondary | ICD-10-CM | POA: Diagnosis not present

## 2023-06-02 DIAGNOSIS — M9901 Segmental and somatic dysfunction of cervical region: Secondary | ICD-10-CM | POA: Diagnosis not present

## 2023-06-02 DIAGNOSIS — M9905 Segmental and somatic dysfunction of pelvic region: Secondary | ICD-10-CM | POA: Diagnosis not present

## 2023-06-02 DIAGNOSIS — M9907 Segmental and somatic dysfunction of upper extremity: Secondary | ICD-10-CM | POA: Diagnosis not present

## 2023-07-04 DIAGNOSIS — M9901 Segmental and somatic dysfunction of cervical region: Secondary | ICD-10-CM | POA: Diagnosis not present

## 2023-07-04 DIAGNOSIS — M9907 Segmental and somatic dysfunction of upper extremity: Secondary | ICD-10-CM | POA: Diagnosis not present

## 2023-07-04 DIAGNOSIS — M9903 Segmental and somatic dysfunction of lumbar region: Secondary | ICD-10-CM | POA: Diagnosis not present

## 2023-07-04 DIAGNOSIS — M9902 Segmental and somatic dysfunction of thoracic region: Secondary | ICD-10-CM | POA: Diagnosis not present

## 2023-07-04 DIAGNOSIS — M9905 Segmental and somatic dysfunction of pelvic region: Secondary | ICD-10-CM | POA: Diagnosis not present

## 2023-07-07 DIAGNOSIS — M9903 Segmental and somatic dysfunction of lumbar region: Secondary | ICD-10-CM | POA: Diagnosis not present

## 2023-07-07 DIAGNOSIS — M9905 Segmental and somatic dysfunction of pelvic region: Secondary | ICD-10-CM | POA: Diagnosis not present

## 2023-07-07 DIAGNOSIS — M9907 Segmental and somatic dysfunction of upper extremity: Secondary | ICD-10-CM | POA: Diagnosis not present

## 2023-07-07 DIAGNOSIS — M9901 Segmental and somatic dysfunction of cervical region: Secondary | ICD-10-CM | POA: Diagnosis not present

## 2023-07-07 DIAGNOSIS — M9902 Segmental and somatic dysfunction of thoracic region: Secondary | ICD-10-CM | POA: Diagnosis not present

## 2023-07-16 DIAGNOSIS — M9907 Segmental and somatic dysfunction of upper extremity: Secondary | ICD-10-CM | POA: Diagnosis not present

## 2023-07-16 DIAGNOSIS — M9903 Segmental and somatic dysfunction of lumbar region: Secondary | ICD-10-CM | POA: Diagnosis not present

## 2023-07-16 DIAGNOSIS — M9901 Segmental and somatic dysfunction of cervical region: Secondary | ICD-10-CM | POA: Diagnosis not present

## 2023-07-16 DIAGNOSIS — M9905 Segmental and somatic dysfunction of pelvic region: Secondary | ICD-10-CM | POA: Diagnosis not present

## 2023-07-16 DIAGNOSIS — M9902 Segmental and somatic dysfunction of thoracic region: Secondary | ICD-10-CM | POA: Diagnosis not present

## 2023-08-04 DIAGNOSIS — M9901 Segmental and somatic dysfunction of cervical region: Secondary | ICD-10-CM | POA: Diagnosis not present

## 2023-08-04 DIAGNOSIS — M9902 Segmental and somatic dysfunction of thoracic region: Secondary | ICD-10-CM | POA: Diagnosis not present

## 2023-08-04 DIAGNOSIS — M9905 Segmental and somatic dysfunction of pelvic region: Secondary | ICD-10-CM | POA: Diagnosis not present

## 2023-08-04 DIAGNOSIS — M9907 Segmental and somatic dysfunction of upper extremity: Secondary | ICD-10-CM | POA: Diagnosis not present

## 2023-08-04 DIAGNOSIS — M9903 Segmental and somatic dysfunction of lumbar region: Secondary | ICD-10-CM | POA: Diagnosis not present

## 2023-08-14 DIAGNOSIS — M9902 Segmental and somatic dysfunction of thoracic region: Secondary | ICD-10-CM | POA: Diagnosis not present

## 2023-08-14 DIAGNOSIS — M9907 Segmental and somatic dysfunction of upper extremity: Secondary | ICD-10-CM | POA: Diagnosis not present

## 2023-08-14 DIAGNOSIS — M9905 Segmental and somatic dysfunction of pelvic region: Secondary | ICD-10-CM | POA: Diagnosis not present

## 2023-08-14 DIAGNOSIS — M9903 Segmental and somatic dysfunction of lumbar region: Secondary | ICD-10-CM | POA: Diagnosis not present

## 2023-08-14 DIAGNOSIS — M9901 Segmental and somatic dysfunction of cervical region: Secondary | ICD-10-CM | POA: Diagnosis not present

## 2023-09-01 ENCOUNTER — Ambulatory Visit (INDEPENDENT_AMBULATORY_CARE_PROVIDER_SITE_OTHER): Payer: BC Managed Care – PPO | Admitting: Dermatology

## 2023-09-01 DIAGNOSIS — Z1283 Encounter for screening for malignant neoplasm of skin: Secondary | ICD-10-CM | POA: Diagnosis not present

## 2023-09-01 DIAGNOSIS — W908XXA Exposure to other nonionizing radiation, initial encounter: Secondary | ICD-10-CM | POA: Diagnosis not present

## 2023-09-01 DIAGNOSIS — L578 Other skin changes due to chronic exposure to nonionizing radiation: Secondary | ICD-10-CM

## 2023-09-01 DIAGNOSIS — Z85820 Personal history of malignant melanoma of skin: Secondary | ICD-10-CM

## 2023-09-01 DIAGNOSIS — L814 Other melanin hyperpigmentation: Secondary | ICD-10-CM

## 2023-09-01 DIAGNOSIS — D229 Melanocytic nevi, unspecified: Secondary | ICD-10-CM

## 2023-09-01 DIAGNOSIS — L820 Inflamed seborrheic keratosis: Secondary | ICD-10-CM | POA: Diagnosis not present

## 2023-09-01 DIAGNOSIS — L918 Other hypertrophic disorders of the skin: Secondary | ICD-10-CM

## 2023-09-01 DIAGNOSIS — L821 Other seborrheic keratosis: Secondary | ICD-10-CM

## 2023-09-01 DIAGNOSIS — D225 Melanocytic nevi of trunk: Secondary | ICD-10-CM

## 2023-09-01 DIAGNOSIS — L719 Rosacea, unspecified: Secondary | ICD-10-CM

## 2023-09-01 DIAGNOSIS — Z7189 Other specified counseling: Secondary | ICD-10-CM

## 2023-09-01 DIAGNOSIS — Z86018 Personal history of other benign neoplasm: Secondary | ICD-10-CM

## 2023-09-01 DIAGNOSIS — D2272 Melanocytic nevi of left lower limb, including hip: Secondary | ICD-10-CM

## 2023-09-01 DIAGNOSIS — D2271 Melanocytic nevi of right lower limb, including hip: Secondary | ICD-10-CM

## 2023-09-01 DIAGNOSIS — D1801 Hemangioma of skin and subcutaneous tissue: Secondary | ICD-10-CM

## 2023-09-01 MED ORDER — VALACYCLOVIR HCL 500 MG PO TABS: ORAL_TABLET | ORAL | 0 refills | Status: DC

## 2023-09-01 NOTE — Progress Notes (Signed)
 Follow-Up Visit   Subjective  Rebecca Peters is a 48 y.o. female who presents for the following: Skin Cancer Screening and Full Body Skin Exam  The patient presents for Total-Body Skin Exam (TBSE) for skin cancer screening and mole check. The patient has spots, moles and lesions to be evaluated, some may be new or changing. She has a few spots on her face, neck, right axilla, and back that she would like removed. Some areas are itchy at times. She would like to discuss treatment for redness of the cheeks.  History of dysplastic nevi and melanoma.     The following portions of the chart were reviewed this encounter and updated as appropriate: medications, allergies, medical history  Review of Systems:  No other skin or systemic complaints except as noted in HPI or Assessment and Plan.  Objective  Well appearing patient in no apparent distress; mood and affect are within normal limits.  A full examination was performed including scalp, head, eyes, ears, nose, lips, neck, chest, axillae, abdomen, back, buttocks, bilateral upper extremities, bilateral lower extremities, hands, feet, fingers, toes, fingernails, and toenails. All findings within normal limits unless otherwise noted below.   Relevant physical exam findings are noted in the Assessment and Plan.  L zygoma x 1, R upper forehead x 1, intermammary chest x 1 (3) Erythematous stuck-on, waxy papule or plaque  Assessment & Plan   SKIN CANCER SCREENING PERFORMED TODAY.  ACTINIC DAMAGE - Chronic condition, secondary to cumulative UV/sun exposure - diffuse scaly erythematous macules with underlying dyspigmentation - Recommend daily broad spectrum sunscreen SPF 30+ to sun-exposed areas, reapply every 2 hours as needed.  - Staying in the shade or wearing long sleeves, sun glasses (UVA+UVB protection) and wide brim hats (4-inch brim around the entire circumference of the hat) are also recommended for sun protection.  - Call for new or  changing lesions.  LENTIGINES, SEBORRHEIC KERATOSES, HEMANGIOMAS - Benign normal skin lesions - Benign-appearing - Call for any changes  MELANOCYTIC NEVI - Tan-brown and/or pink-flesh-colored symmetric macules and papules. Photos compared from 03/27/2021 and 11/15/2019. L spinal lower back: 2.30mm med dark brown macule   Right Lower Leg inferior to scar: 2.59mm med dark brown macule   L med calf: 2.64mm med dark brown macule   Right lateral lower leg above ankle: 2.34mm med light brown macule   Spinal upper back: Small brown macules and papules (see photo)  - Benign appearing on exam today - Observation - Call clinic for new or changing moles - Recommend daily use of broad spectrum spf 30+ sunscreen to sun-exposed areas.   HISTORY OF DYSPLASTIC NEVI - No evidence of recurrence today - Recommend regular full body skin exams - Recommend daily broad spectrum sunscreen SPF 30+ to sun-exposed areas, reapply every 2 hours as needed.  - Call if any new or changing lesions are noted between office visits  HISTORY OF MELANOMA Right medial lower leg Clarks level: IV. Breslow's 1.43mm, excised at [facility] 2011  - No evidence of recurrence today - Recommend regular full body skin exams - Recommend daily broad spectrum sunscreen SPF 30+ to sun-exposed areas, reapply every 2 hours as needed.  - Call if any new or changing lesions are noted between office visits  ROSACEA Exam Mid face erythema with telangiectasias   Chronic and persistent condition with duration or expected duration over one year. Condition is bothersome/symptomatic for patient. Currently flared.  Rosacea is a chronic progressive skin condition usually affecting the face of  adults, causing redness and/or acne bumps. It is treatable but not curable. It sometimes affects the eyes (ocular rosacea) as well. It may respond to topical and/or systemic medication and can flare with stress, sun exposure, alcohol, exercise, topical  steroids (including hydrocortisone/cortisone 10) and some foods.  Daily application of broad spectrum spf 30+ sunscreen to face is recommended to reduce flares.    Treatment Plan Counseling for BBL / IPL / Laser and Coordination of Care Discussed the treatment option of Broad Band Light (BBL) /Intense Pulsed Light (IPL)/ Laser for skin discoloration, including brown spots and redness.  Typically we recommend at least 1-3 treatment sessions about 5-8 weeks apart for best results.  Cannot have tanned skin when BBL performed, and regular use of sunscreen/photoprotection is advised after the procedure to help maintain results. The patient's condition may also require "maintenance treatments" in the future.  The fee for BBL / laser treatments is $350 per treatment session for the whole face.  A fee can be quoted for other parts of the body.  Insurance typically does not pay for BBL/laser treatments and therefore the fee is an out-of-pocket cost. Recommend prophylactic valtrex treatment. Once scheduled for procedure, will send Rx in prior to patient's appointment.  Valtrex 500 mg take 1 po BID x 1 week starting 1 day prior to procedure.    Acrochordons (Skin Tags), Symptomatic, irritating, patient would like treated. - Fleshy, skin-colored pedunculated papules L mid upper back, R axilla - Benign appearing.  - Patient desires removal. Reviewed that this may not be covered by insurance and they may be charged a fee for removal. Patient would like insurance to be billed for procedure.  - Prior to the procedure, reviewed the expected small wound. Also reviewed the risk of leaving a small scar and the small risk of infection.  PROCEDURE - The areas were prepped with isopropyl alcohol. A small amount of lidocaine 1% with epinephrine was injected at the base of each lesion to achieve good local anesthesia. The skin tags were removed using a snip technique. Aluminum chloride was used for hemostasis. Petrolatum and  a bandage were applied. The procedure was tolerated well. - Wound care was reviewed with the patient. They were advised to call with any concerns. Total number of treated acrochordons 3 - L mid upper back x 1, R axilla x 2  LENTIGO VS SK Exam: 4 mm tan macule at left sternal notch Due to sun exposure Treatment Plan: Benign-appearing, observe. Recommend daily broad spectrum sunscreen SPF 30+ to sun-exposed areas, reapply every 2 hours as needed.  Call for any changes  INFLAMED SEBORRHEIC KERATOSIS (3) L zygoma x 1, R upper forehead x 1, intermammary chest x 1 (3) Symptomatic, irritating, patient would like treated. Destruction of lesion - L zygoma x 1, R upper forehead x 1, intermammary chest x 1 (3)  Destruction method: cryotherapy   Informed consent: discussed and consent obtained   Lesion destroyed using liquid nitrogen: Yes   Region frozen until ice ball extended beyond lesion: Yes   Outcome: patient tolerated procedure well with no complications   Post-procedure details: wound care instructions given   Additional details:  Prior to procedure, discussed risks of blister formation, small wound, skin dyspigmentation, or rare scar following cryotherapy. Recommend Vaseline ointment to treated areas while healing.  Return as scheduled for BBL 09/15/2023, face, 1 yr TBSE.  Wendee Beavers, CMA, am acting as scribe for Willeen Niece, MD .   Documentation: I have reviewed the  above documentation for accuracy and completeness, and I agree with the above.  Artemio Larry, MD

## 2023-09-01 NOTE — Patient Instructions (Addendum)
 Counseling for BBL / IPL / Laser and Coordination of Care Discussed the treatment option of Broad Band Light (BBL) /Intense Pulsed Light (IPL)/ Laser for skin discoloration, including brown spots and redness.  Typically we recommend at least 1-3 treatment sessions about 5-8 weeks apart for best results.  Cannot have tanned skin when BBL performed, and regular use of sunscreen/photoprotection is advised after the procedure to help maintain results. The patient's condition may also require "maintenance treatments" in the future.  The fee for BBL / laser treatments is $350 per treatment session for the whole face.  A fee can be quoted for other parts of the body.  Insurance typically does not pay for BBL/laser treatments and therefore the fee is an out-of-pocket cost. Recommend prophylactic valtrex treatment. Once scheduled for procedure, will send Rx in prior to patient's appointment.     Due to recent changes in healthcare laws, you may see results of your pathology and/or laboratory studies on MyChart before the doctors have had a chance to review them. We understand that in some cases there may be results that are confusing or concerning to you. Please understand that not all results are received at the same time and often the doctors may need to interpret multiple results in order to provide you with the best plan of care or course of treatment. Therefore, we ask that you please give [name] 2 business days to thoroughly review all your results before contacting the office for clarification. Should we see a critical lab result, you will be contacted sooner.   If You Need Anything After Your Visit  If you have any questions or concerns for your doctor, please call our main line at [phone] and press option 4 to reach your doctor's medical assistant. If no one answers, please leave a voicemail as directed and we will return your call as soon as possible. Messages left after 4 pm will be answered the  following business day.   You may also send [name] a message via MyChart. We typically respond to MyChart messages within 1-2 business days.  For prescription refills, please ask your pharmacy to contact our office. Our fax number is [phone].  If you have an urgent issue when the clinic is closed that cannot wait until the next business day, you can page your doctor at the number below.    Please note that while we do our best to be available for urgent issues outside of office hours, we are not available 24/7.   If you have an urgent issue and are unable to reach [name], you may choose to seek medical care at your doctor's office, retail clinic, urgent care center, or emergency room.  If you have a medical emergency, please immediately call 911 or go to the emergency department.  Pager Numbers  - Dr. Gwen Pounds: [phone]  - Dr. Roseanne Reno: [phone]  - Dr. Katrinka Blazing: [phone]   In the event of inclement weather, please call our main line at [phone] for an update on the status of any delays or closures.  Dermatology Medication Tips: Please keep the boxes that topical medications come in in order to help keep track of the instructions about where and how to use these. Pharmacies typically print the medication instructions only on the boxes and not directly on the medication tubes.   If your medication is too expensive, please contact our office at [phone] option 4 or send [name] a message through MyChart.   We are unable to tell what  your co-pay for medications will be in advance as this is different depending on your insurance coverage. However, we may be able to find a substitute medication at lower cost or fill out paperwork to get insurance to cover a needed medication.   If a prior authorization is required to get your medication covered by your insurance company, please allow [name] 1-2 business days to complete this process.  Drug prices often vary depending on where the  prescription is filled and some pharmacies may offer cheaper prices.  The website www.goodrx.com contains coupons for medications through different pharmacies. The prices here do not account for what the cost may be with help from insurance (it may be cheaper with your insurance), but the website can give you the price if you did not use any insurance.  - You can print the associated coupon and take it with your prescription to the pharmacy.  - You may also stop by our office during regular business hours and pick up a GoodRx coupon card.  - If you need your prescription sent electronically to a different pharmacy, notify our office through [facility] or by phone at [phone] option 4.     Si Usted Necesita Algo Despus de Su Visita  Tambin puede enviarnos un mensaje a travs de Clinical cytogeneticist. Por lo general respondemos a los mensajes de MyChart en el transcurso de 1 a 2 das hbiles.  Para renovar recetas, por favor pida a su farmacia que se ponga en contacto con nuestra oficina. Annie Sable de fax es [place] [phone].  Si tiene un asunto urgente cuando la clnica est cerrada y que no puede esperar hasta el siguiente da hbil, puede llamar/localizar a su doctor(a) al nmero que aparece a continuacin.   Por favor, tenga en cuenta que aunque hacemos todo lo posible para estar disponibles para asuntos urgentes fuera del horario de [place], no estamos disponibles las 24 horas del da, los 7 [address]  [address] de la [place].   Si tiene un problema urgente y no puede comunicarse con nosotros, puede optar por buscar atencin mdica  en el consultorio de su doctor(a), en una clnica privada, en un centro de atencin urgente o en una sala de emergencias.  Si tiene Engineer, drilling, por favor llame inmediatamente al 911 o vaya a la sala de emergencias.  Nmeros de bper  - Dr. Gwen Pounds: [phone]  - Dra. Roseanne Reno: [phone]  - Dr. Katrinka Blazing: [phone]   En caso de inclemencias del  tiempo, por favor llame a Lacy Duverney principal al [phone] para una actualizacin sobre el [place] de cualquier retraso o cierre.  Consejos para la medicacin en dermatologa: Por favor, guarde las cajas en las que vienen los medicamentos de uso tpico para ayudarle a seguir las instrucciones sobre dnde y cmo usarlos. Las farmacias generalmente imprimen las instrucciones del medicamento slo en las cajas y no directamente en los tubos del [place].   Si su medicamento es muy caro, por favor, pngase en contacto con Rolm Gala llamando al [phone] y presione la opcin 4 o envenos un mensaje a travs de Clinical cytogeneticist.   No podemos decirle cul ser su copago por los medicamentos por adelantado ya que esto es diferente dependiendo de la cobertura de su seguro. Sin embargo, es posible que podamos encontrar un medicamento sustituto a Audiological scientist un formulario para que el seguro cubra el medicamento que se considera necesario.   Si se requiere una autorizacin previa para que su compaa de seguros [place] su  medicamento, por favor permtanos de 1 a 2 das hbiles para completar [address]  Los precios de los medicamentos varan con frecuencia dependiendo del Environmental consultant de dnde se surte la receta y alguna farmacias pueden ofrecer precios ms baratos.  El sitio web www.goodrx.com tiene cupones para medicamentos de Health and safety inspector. Los precios aqu no tienen en cuenta lo que podra costar con la ayuda del seguro (puede ser ms barato con su seguro), pero el sitio web puede darle el precio si no utiliz Tourist information centre manager.  - Puede imprimir el cupn correspondiente y llevarlo con su receta a la farmacia.  - Tambin puede pasar por nuestra oficina durante el horario de atencin regular y Education officer, museum una tarjeta de cupones de GoodRx.  - Si necesita que su receta se enve electrnicamente a una farmacia diferente, informe a nuestra oficina a travs de MyChart de [place] o por telfono  llamando al [phone] y presione la opcin 4.

## 2023-09-15 ENCOUNTER — Ambulatory Visit (INDEPENDENT_AMBULATORY_CARE_PROVIDER_SITE_OTHER): Payer: Self-pay | Admitting: Dermatology

## 2023-09-15 DIAGNOSIS — L814 Other melanin hyperpigmentation: Secondary | ICD-10-CM

## 2023-09-15 DIAGNOSIS — L719 Rosacea, unspecified: Secondary | ICD-10-CM

## 2023-09-15 NOTE — Progress Notes (Signed)
 Follow-Up Visit   Subjective  Rebecca Peters is a 48 y.o. female who presents for the following: Rosacea face, pt presents for BBL today for red areas and brown spots   The following portions of the chart were reviewed this encounter and updated as appropriate: medications, allergies, medical history  Review of Systems:  No other skin or systemic complaints except as noted in HPI or Assessment and Plan.  Objective  Well appearing patient in no apparent distress; mood and affect are within normal limits.   A focused examination was performed of the following areas: face  Relevant exam findings are noted in the Assessment and Plan.              1st pass (pigmented lesions cheeks, chin)   2nd pass (pigmented lesions forehead, temples, nose, under eyes upper lip, chin)   3rd pass (Vascular cheeks, chin)    Assessment & Plan   LENTIGOS  face Exam: brown macules face  Treatment Plan: BBL today   [place] BBL - 09/15/23 1500      Patient Details   Skin Type: II    Anesthestic Cream Applied: No    Photo Takes: Yes    Consent Signed: Yes      Treatment Details   Date: 09/15/23    Treatment #: 1    Area: face   pt was given prescription for Valtrex  but opted not to take   Filter: 1st Pass;2nd Pass;3rd Pass      1st Pass   Location: F   Pigmented lesion cheeks, chin   Device: 515 filter    BBL j/cm2: 10    PW Msec Sec: 10    Cooling Temp: 15    Pulses: 100    15x45: this crystal used      2nd Pass   Location: F   pigmented lesions forehead, temples, nose, under eyes,  upper lip, chin   Device: 515 filter    BBL j/cm2: 12    PW Msec Sec: 10    Cooling Temp: 15   Pulses: 215    15x15: this crystal used     Patient tolerated the procedure well.   Paulene Boron avoidance was stressed. The patient will call with any problems, questions or concerns prior to their next appointment.  Laser safety: Patient was advised in laser safety.  Patient was fitted  with laser safety goggles and advised to keep eyes closed during procedure with goggles on. Staff and provider ensured that patient and their own safety goggles were also on and eyes protected during procedure. Laser room door was secured and locked from the inside. Laser room door has laser safety sign affixed to the outside of the door.           ROSACEA face Exam Mid face erythema with telangiectasias   Chronic and persistent condition with duration or expected duration over one year. Condition is symptomatic/ bothersome to patient. Not currently at goal.   Rosacea is a chronic progressive skin condition usually affecting the face of adults, causing redness and/or acne bumps. It is treatable but not curable. It sometimes affects the eyes (ocular rosacea) as well. It may respond to topical and/or systemic medication and can flare with stress, sun exposure, alcohol, exercise, topical steroids (including hydrocortisone/cortisone 10) and some foods.  Daily application of broad spectrum spf 30+ sunscreen to face is recommended to reduce flares.  Patient denies grittiness of the eyes  Treatment Plan BBL today Laser kit and  cooling mask given to patient   [place] BBL - 09/15/23 1500      Patient Details   Skin Type: II    Anesthestic Cream Applied: No    Photo Takes: Yes    Consent Signed: Yes      Treatment Details   Date: 09/15/23    Treatment #: 1    Area: face   pt was given prescription for Valtrex  but opted not to take   Filter: 1st Pass;2nd Pass;3rd Pass      3rd Pass   Location: F   Vascular cheeks, chin   Device: 560 filter    BBL j/cm2: 15    PW Msec Sec: 15    Cooling Temp: 15    Pulses: 73    15x15: this crystal used      Patient tolerated the procedure well.   Paulene Boron avoidance was stressed. The patient will call with any problems, questions or concerns prior to their next appointment.  Laser safety: Patient was advised in laser safety.  Patient was fitted  with laser safety goggles and advised to keep eyes closed during procedure with goggles on. Staff and provider ensured that patient and their own safety goggles were also on and eyes protected during procedure. Laser room door was secured and locked from the inside. Laser room door has laser safety sign affixed to the outside of the door.       Return for pt can schedule additional BBL >4 weeks.  I, Rollie Clipper, RMA, am acting as scribe for Artemio Larry, MD .   Documentation: I have reviewed the above documentation for accuracy and completeness, and I agree with the above.  Artemio Larry, MD

## 2023-09-15 NOTE — Patient Instructions (Signed)

## 2023-09-16 ENCOUNTER — Encounter: Admitting: Primary Care

## 2023-09-16 ENCOUNTER — Telehealth: Payer: Self-pay

## 2023-09-16 NOTE — Telephone Encounter (Signed)
 Patient doing well after yesterdays BBL appointment.  She did have a little swelling under eyes but she has been using her gel cooling mask which has helped.  Advised pt to call if she has any issues.Rebecca Peters

## 2023-09-24 ENCOUNTER — Other Ambulatory Visit

## 2023-09-24 DIAGNOSIS — R5382 Chronic fatigue, unspecified: Secondary | ICD-10-CM

## 2023-09-24 DIAGNOSIS — R7309 Other abnormal glucose: Secondary | ICD-10-CM

## 2023-09-24 DIAGNOSIS — Z0000 Encounter for general adult medical examination without abnormal findings: Secondary | ICD-10-CM | POA: Diagnosis not present

## 2023-09-24 DIAGNOSIS — Z13220 Encounter for screening for lipoid disorders: Secondary | ICD-10-CM

## 2023-09-24 LAB — COMPREHENSIVE METABOLIC PANEL WITH GFR
ALT: 14 U/L (ref 0–35)
AST: 16 U/L (ref 0–37)
Albumin: 4.1 g/dL (ref 3.5–5.2)
Alkaline Phosphatase: 64 U/L (ref 39–117)
BUN: 14 mg/dL (ref 6–23)
CO2: 26 meq/L (ref 19–32)
Calcium: 8.9 mg/dL (ref 8.4–10.5)
Chloride: 105 meq/L (ref 96–112)
Creatinine, Ser: 0.81 mg/dL (ref 0.40–1.20)
GFR: 85.25 mL/min (ref >60.00)
Glucose, Bld: 86 mg/dL (ref 70–99)
Potassium: 4.1 meq/L (ref 3.5–5.1)
Sodium: 139 meq/L (ref 135–145)
Total Bilirubin: 0.4 mg/dL (ref 0.2–1.2)
Total Protein: 6.8 g/dL (ref 6.0–8.3)

## 2023-09-24 LAB — LIPID PANEL
Cholesterol: 176 mg/dL (ref 0–200)
HDL: 48.1 mg/dL (ref >39.00)
LDL Cholesterol: 100 mg/dL — ABNORMAL HIGH (ref 0–99)
NonHDL: 127.8
Total CHOL/HDL Ratio: 4
Triglycerides: 140 mg/dL (ref 0.0–149.0)
VLDL: 28 mg/dL (ref 0.0–40.0)

## 2023-09-24 LAB — VITAMIN D 25 HYDROXY (VIT D DEFICIENCY, FRACTURES): VITD: 27.19 ng/mL — ABNORMAL LOW (ref 30.00–100.00)

## 2023-09-24 LAB — CBC WITH DIFFERENTIAL/PLATELET
Basophils Absolute: 0.1 10*3/uL (ref 0.0–0.1)
Basophils Relative: 1 % (ref 0.0–3.0)
Eosinophils Absolute: 0.3 10*3/uL (ref 0.0–0.7)
Eosinophils Relative: 5.1 % — ABNORMAL HIGH (ref 0.0–5.0)
HCT: 39.8 % (ref 36.0–46.0)
Hemoglobin: 13.5 g/dL (ref 12.0–15.0)
Lymphocytes Relative: 28.6 % (ref 12.0–46.0)
Lymphs Abs: 1.8 10*3/uL (ref 0.7–4.0)
MCHC: 33.8 g/dL (ref 30.0–36.0)
MCV: 91.1 fl (ref 78.0–100.0)
Monocytes Absolute: 0.5 10*3/uL (ref 0.1–1.0)
Monocytes Relative: 8 % (ref 3.0–12.0)
Neutro Abs: 3.6 10*3/uL (ref 1.4–7.7)
Neutrophils Relative %: 57.3 % (ref 43.0–77.0)
Platelets: 261 10*3/uL (ref 150.0–400.0)
RBC: 4.37 Mil/uL (ref 3.87–5.11)
RDW: 12.8 % (ref 11.5–15.5)
WBC: 6.2 10*3/uL (ref 4.0–10.5)

## 2023-09-24 LAB — IRON: Iron: 88 ug/dL (ref 42–145)

## 2023-09-24 LAB — HEMOGLOBIN A1C: Hgb A1c MFr Bld: 5.3 % (ref 4.6–6.5)

## 2023-09-24 LAB — TSH: TSH: 2.65 u[IU]/mL (ref 0.35–5.50)

## 2023-10-10 ENCOUNTER — Encounter: Payer: Self-pay | Admitting: Family Medicine

## 2023-10-10 ENCOUNTER — Ambulatory Visit (INDEPENDENT_AMBULATORY_CARE_PROVIDER_SITE_OTHER): Admitting: Family Medicine

## 2023-10-10 VITALS — BP 122/78 | HR 60 | Temp 98.1°F | Ht 61.5 in | Wt 162.5 lb

## 2023-10-10 DIAGNOSIS — Z13220 Encounter for screening for lipoid disorders: Secondary | ICD-10-CM

## 2023-10-10 DIAGNOSIS — Z0000 Encounter for general adult medical examination without abnormal findings: Secondary | ICD-10-CM | POA: Diagnosis not present

## 2023-10-10 DIAGNOSIS — Z85828 Personal history of other malignant neoplasm of skin: Secondary | ICD-10-CM

## 2023-10-10 DIAGNOSIS — R7309 Other abnormal glucose: Secondary | ICD-10-CM | POA: Diagnosis not present

## 2023-10-10 DIAGNOSIS — Z1211 Encounter for screening for malignant neoplasm of colon: Secondary | ICD-10-CM

## 2023-10-10 DIAGNOSIS — E559 Vitamin D deficiency, unspecified: Secondary | ICD-10-CM

## 2023-10-10 DIAGNOSIS — R5382 Chronic fatigue, unspecified: Secondary | ICD-10-CM | POA: Diagnosis not present

## 2023-10-10 DIAGNOSIS — Z1231 Encounter for screening mammogram for malignant neoplasm of breast: Secondary | ICD-10-CM

## 2023-10-10 NOTE — Assessment & Plan Note (Signed)
 Mammogram ordered Pt will call to schedule  Normal exam

## 2023-10-10 NOTE — Assessment & Plan Note (Signed)
 Normal iron at 88 this draw

## 2023-10-10 NOTE — Assessment & Plan Note (Signed)
 Cologuard negative 03/2022  Declines colonoscopy so far

## 2023-10-10 NOTE — Assessment & Plan Note (Signed)
 Sees derm regularly  Had laser treatment on face for rosacea and solar changes

## 2023-10-10 NOTE — Patient Instructions (Addendum)
 Try to get 1200-1500 mg of calcium per day with at least 2000 iu of vitamin D3 - for bone health (your vitamin D  level is low)    Stay active  Add some strength training to your routine, this is important for bone and brain health and can reduce your risk of falls and help your body use insulin properly and regulate weight  Light weights, exercise bands , and internet videos are a good way to start  Yoga (chair or regular), machines , floor exercises or a gym with machines are also good options   Continue to eat healthy Try to get most of your carbohydrates from produce (with the exception of white potatoes) and whole grains Eat less bread/pasta/rice/snack foods/cereals/sweets and other items from the middle of the grocery store (processed carbs) Avoid red meat/ fried foods/ egg yolks/ fatty breakfast meats/ butter, cheese and high fat dairy/ and shellfish      You have an order for:  [x]   3D Mammogram  []   Bone Density     Please call for appointment:   [x]   [facility]  [address] [place] [address]  [phone]  []   [facility])   [address]  [place], [place] [address]  [phone]  []   [facility]      [address] [place], [place]        [phone]         []   [facility]  [address] [place], [place]  [phone]  []  [facility]   [address]   [place], [place] [address]  [phone]  []  [facility]  [address] [place], [place] [address] [phone]    Make sure to wear two piece clothing  No lotions powders or deodorants the day of the appointment Make sure to bring picture ID and insurance card.  Bring list of medications you are currently taking including any supplements.    Schedule your screening mammogram through MyChart!   Select [place] imaging sites can now be scheduled through MyChart.  Log into your MyChart account.  Go to 'Visit' (or 'Appointments' if  on mobile App) --> Schedule an  Appointment  Under 'Select a Reason for Visit' choose the Mammogram  Screening option.  Complete the pre-visit questions  and select the time and place that  best fits your schedule

## 2023-10-10 NOTE — Assessment & Plan Note (Signed)
 Lab Results  Component Value Date   HGBA1C 5.3 09/24/2023   HGBA1C 5.3 03/07/2022   HGBA1C 5.0 03/12/2021   Staying below prediabetic range disc imp of low glycemic diet and wt loss to prevent DM2

## 2023-10-10 NOTE — Progress Notes (Signed)
 Subjective:    Patient ID: Rebecca Peters, female    DOB: 1976/01/24, 48 y.o.   MRN: [id number]  HPI  Here for health maintenance exam and to review chronic medical problems   Wt Readings from Last 3 Encounters:  10/10/23 162 lb 8 oz (73.7 kg)  09/11/22 162 lb (73.5 kg)  03/14/22 158 lb 8 oz (71.9 kg)   30.21 kg/m  Vitals:   10/10/23 0919  BP: 122/78  Pulse: 60  Temp: 98.1 F (36.7 C)  SpO2: 99%    Immunization History  Administered Date(s) Administered   Influenza Split 04/07/2012   Influenza,inj,Quad PF,6+ Mos 03/17/2013, 03/30/2014, 04/05/2015, 04/23/2017, 03/09/2018, 03/12/2021, 03/14/2022   Td 05/17/2010, 03/12/2021    Health Maintenance Due  Topic Date Due   MAMMOGRAM  08/08/2023     Mammogram 07/2022 - needs to get this  Self breast exam-no lumps   Gyn health Pap 06/2019  Regular periods - last 3 days maximum, no problems  Gyn left practice  No new partner No symptoms   Eats well  Exercises  Cannot loose weight    Colon cancer screening -cologuard 03/2022 negative  Not ready to do colonoscopy yet   Bone health   Falls-none  Fractures-none  Supplements  Last vitamin D  Lab Results  Component Value Date   VD25OH 27.19 (L) 09/24/2023    Exercise :  [employer] bike / or bike inside [place] training  Some weights -not enough     Sees dermatologist  Being treated for rosacea with laser   Mood    03/14/2022    9:46 AM 03/12/2021   11:37 AM 03/09/2018    2:55 PM 04/23/2017    3:46 PM 04/07/2012   10:30 AM  Depression screen PHQ 2/9  Decreased Interest 0 1 0 0 0  Down, Depressed, Hopeless 0 1 0 1 0  PHQ - 2 Score 0 2 0 1 0  Altered sleeping 0 0 0    Tired, decreased energy 0 2 1    Change in appetite 0 0 0    Feeling bad or failure about yourself  0 0 0    Trouble concentrating 0 1 0    Moving slowly or fidgety/restless 0 0 0    Suicidal thoughts 0 0 0    PHQ-9 Score 0 5 1    Difficult doing work/chores Not  difficult at all Somewhat difficult       Glucose Lab Results  Component Value Date   HGBA1C 5.3 09/24/2023   HGBA1C 5.3 03/07/2022   HGBA1C 5.0 03/12/2021   Cholesterol Lab Results  Component Value Date   CHOL 176 09/24/2023   CHOL 153 03/07/2022   CHOL 182 03/12/2021   Lab Results  Component Value Date   HDL 48.10 09/24/2023   HDL 43.40 03/07/2022   HDL 52.90 03/12/2021   Lab Results  Component Value Date   LDLCALC 100 (H) 09/24/2023   LDLCALC 85 03/07/2022   LDLCALC 108 (H) 03/12/2021   Lab Results  Component Value Date   TRIG 140.0 09/24/2023   TRIG 124.0 03/07/2022   TRIG 102.0 03/12/2021   Lab Results  Component Value Date   CHOLHDL 4 09/24/2023   CHOLHDL 4 03/07/2022   CHOLHDL 3 03/12/2021   No results found for: "LDLDIRECT"  Continues to eat healthy  Occational ice cream   Other labs Lab Results  Component Value Date   NA 139 09/24/2023   K 4.1 09/24/2023  CO2 26 09/24/2023   GLUCOSE 86 09/24/2023   BUN 14 09/24/2023   CREATININE 0.81 09/24/2023   CALCIUM 8.9 09/24/2023   GFR 85.25 09/24/2023   GFRNONAA >60 08/13/2020   Lab Results  Component Value Date   ALT 14 09/24/2023   AST 16 09/24/2023   ALKPHOS 64 09/24/2023   BILITOT 0.4 09/24/2023   Lab Results  Component Value Date   WBC 6.2 09/24/2023   HGB 13.5 09/24/2023   HCT 39.8 09/24/2023   MCV 91.1 09/24/2023   PLT 261.0 09/24/2023   Lab Results  Component Value Date   TSH 2.65 09/24/2023   Lab Results  Component Value Date   IRON 88 09/24/2023   TIBC 212 (L) 04/18/2022   FERRITIN 42 04/18/2022     Patient Active Problem List   Diagnosis Date Noted   Vitamin D  deficiency 10/11/2023   Encounter for screening mammogram for breast cancer 04/23/2021   Colon cancer screening 03/12/2021   Lump of skin 03/12/2021   Fibrocystic disease of both breasts 06/20/2014   Routine general medical examination at a health care facility 03/24/2011   Screening for lipoid disorders  03/22/2011   Elevated glucose level 03/22/2011   H/O abnormal cervical Papanicolaou smear 05/17/2010   SKIN CANCER, HX OF 05/15/2010   Past Medical History:  Diagnosis Date   Cancer (HCC)    melanoma   Dysplasia of cervix, unspecified    Dysplastic nevus 05/10/2010   Left mid back. Mild atypia. Close to margin.   Dysplastic nevus 02/22/2014   Right central upper abdomen. Mild atypia. Limited margins free.   Dysplastic nevus 02/22/2014   Left flank. Moderate atypia. Lateral margin involved.   Dysplastic nevus 01/16/2015   Right spinal upper back. Mild atypia. Limited margins free.    Dysplastic nevus 02/04/2017   Right medial breast. Mild atypia. Deep margin focally involved.    Dysplastic nevus 02/04/2017   Left upper back. Mild atypia. Limited margins free.    Late menses    long cycle---menses every 6 weeks   Melanoma (HCC) 02/08/2010   Right lower leg. Compound melanocytic proliferation with severe atypia, best interpreted as Melanoma. Clarkes level: IV. Breslow's 1.35mm, excised at [facility]   Personal history of other malignant neoplasm of skin    Past Surgical History:  Procedure Laterality Date   APPENDECTOMY  5/10   COLPOSCOPY  12/11   MELANOMA EXCISION  10/11   lymphnodes stage 2; wide excision with 2 LN removed   Social History   Tobacco Use   Smoking status: Never   Smokeless tobacco: Never  Substance Use Topics   Alcohol use: Yes    Alcohol/week: 0.0 standard drinks of alcohol    Comment: 1 drink every few months   Drug use: No   Family History  Problem Relation Age of Onset   Hyperlipidemia Father    Hypertension Father    Hyperlipidemia Mother    Breast cancer Maternal Grandmother    Cancer Neg Hx    Allergies  Allergen Reactions   Metoclopramide Hcl     REACTION: trouble breathing and muscle spasms to face   No current outpatient medications on file prior to visit.   No current facility-administered medications on file prior to visit.    Review  of Systems  Constitutional:  Negative for activity change, appetite change, fatigue, fever and unexpected weight change.  HENT:  Negative for congestion, ear pain, rhinorrhea, sinus pressure and sore throat.   Eyes:  Negative for  pain, redness and visual disturbance.  Respiratory:  Negative for cough, shortness of breath and wheezing.   Cardiovascular:  Negative for chest pain and palpitations.  Gastrointestinal:  Negative for abdominal pain, blood in stool, constipation and diarrhea.  Endocrine: Negative for polydipsia and polyuria.  Genitourinary:  Negative for dysuria, frequency and urgency.  Musculoskeletal:  Negative for arthralgias, back pain and myalgias.  Skin:  Negative for pallor and rash.  Allergic/Immunologic: Negative for environmental allergies.  Neurological:  Negative for dizziness, syncope and headaches.  Hematological:  Negative for adenopathy. Does not bruise/bleed easily.  Psychiatric/Behavioral:  Negative for decreased concentration and dysphoric mood. The patient is not nervous/anxious.        Objective:   Physical Exam Constitutional:      General: She is not in acute distress.    Appearance: Normal appearance. She is well-developed. She is not ill-appearing or diaphoretic.  HENT:     Head: Normocephalic and atraumatic.     Right Ear: Tympanic membrane, ear canal and external ear normal.     Left Ear: Tympanic membrane, ear canal and external ear normal.     Nose: Nose normal. No congestion.     Mouth/Throat:     Mouth: Mucous membranes are moist.     Pharynx: Oropharynx is clear. No posterior oropharyngeal erythema.  Eyes:     General: No scleral icterus.    Extraocular Movements: Extraocular movements intact.     Conjunctiva/sclera: Conjunctivae normal.     Pupils: Pupils are equal, round, and reactive to light.  Neck:     Thyroid : No thyromegaly.     Vascular: No carotid bruit or JVD.  Cardiovascular:     Rate and Rhythm: Normal rate and regular  rhythm.     Pulses: Normal pulses.     Heart sounds: Normal heart sounds.     No gallop.  Pulmonary:     Effort: Pulmonary effort is normal. No respiratory distress.     Breath sounds: Normal breath sounds. No wheezing.     Comments: Good air exch Chest:     Chest wall: No tenderness.  Abdominal:     General: Bowel sounds are normal. There is no distension or abdominal bruit.     Palpations: Abdomen is soft. There is no mass.     Tenderness: There is no abdominal tenderness.     Hernia: No hernia is present.  Genitourinary:    Comments: Breast exam: No mass, nodules, thickening, tenderness, bulging, retraction, inflamation, nipple discharge or skin changes noted.  No axillary or clavicular LA.     Musculoskeletal:        General: No tenderness. Normal range of motion.     Cervical back: Normal range of motion and neck supple. No rigidity. No muscular tenderness.     Right lower leg: No edema.     Left lower leg: No edema.     Comments: No kyphosis   Lymphadenopathy:     Cervical: No cervical adenopathy.  Skin:    General: Skin is warm and dry.     Coloration: Skin is not pale.     Findings: No erythema or rash.     Comments: Solar lentigines diffusely   Neurological:     Mental Status: She is alert. Mental status is at baseline.     Cranial Nerves: No cranial nerve deficit.     Motor: No abnormal muscle tone.     Coordination: Coordination normal.     Gait: Gait normal.  Deep Tendon Reflexes: Reflexes are normal and symmetric.  Psychiatric:        Mood and Affect: Mood normal.        Cognition and Memory: Cognition and memory normal.           Assessment & Plan:   Problem List Items Addressed This Visit       Other   Vitamin D  deficiency   Last vitamin D  Lab Results  Component Value Date   VD25OH 27.19 (L) 09/24/2023   Encouraged to start ca plus D 2000 international units daily for bone and overall health      SKIN CANCER, HX OF   Sees derm  regularly  Had laser treatment on face for rosacea and solar changes       Screening for lipoid disorders   Disc goals for lipids and reasons to control them Rev last labs with pt Rev low sat fat diet in detail LDL 100         Routine general medical examination at a health care facility - Primary   Reviewed health habits including diet and exercise and skin cancer prevention Reviewed appropriate screening tests for age  Also reviewed health mt list, fam hx and immunization status , as well as social and family history   See HPI Labs reviewed and ordered Health Maintenance  Topic Date Due   Mammogram  08/08/2023   Hepatitis C Screening  10/09/2024*   HIV Screening  10/09/2024*   COVID-19 Vaccine (3 - Pfizer risk series) 10/25/2024*   Flu Shot  12/19/2023   Pap with HPV screening  06/27/2024   Cologuard (Stool DNA test)  03/31/2025   DTaP/Tdap/Td vaccine (3 - Tdap) 03/13/2031   HPV Vaccine  Aged Out   Meningitis B Vaccine  Aged Out  *Topic was postponed. The date shown is not the original due date.    Mammogram ordered  Discussed fall prevention, supplements and exercise for bone density   Plans to start ca and D Pep due in 2026 Cologuard due in 2026 -declines colonoscopy PHQ 0        RESOLVED: Iron excess   Normal iron at 88 this draw       RESOLVED: Fatigue   Encounter for screening mammogram for breast cancer   Mammogram ordered Pt will call to schedule Normal exam      Relevant Orders   MM 3D SCREENING MAMMOGRAM BILATERAL BREAST   Elevated glucose level   Lab Results  Component Value Date   HGBA1C 5.3 09/24/2023   HGBA1C 5.3 03/07/2022   HGBA1C 5.0 03/12/2021   Staying below prediabetic range disc imp of low glycemic diet and wt loss to prevent DM2       Colon cancer screening   Cologuard negative 03/2022  Declines colonoscopy so far

## 2023-10-10 NOTE — Assessment & Plan Note (Signed)
 Reviewed health habits including diet and exercise and skin cancer prevention Reviewed appropriate screening tests for age  Also reviewed health mt list, fam hx and immunization status , as well as social and family history   See HPI Labs reviewed and ordered Health Maintenance  Topic Date Due   Mammogram  08/08/2023   Hepatitis C Screening  10/09/2024*   HIV Screening  10/09/2024*   COVID-19 Vaccine (3 - Pfizer risk series) 10/25/2024*   Flu Shot  12/19/2023   Pap with HPV screening  06/27/2024   Cologuard (Stool DNA test)  03/31/2025   DTaP/Tdap/Td vaccine (3 - Tdap) 03/13/2031   HPV Vaccine  Aged Out   Meningitis B Vaccine  Aged Out  *Topic was postponed. The date shown is not the original due date.    Mammogram ordered  Discussed fall prevention, supplements and exercise for bone density   Plans to start ca and D Pep due in 2026 Cologuard due in 2026 -declines colonoscopy PHQ 0

## 2023-10-10 NOTE — Assessment & Plan Note (Signed)
 Disc goals for lipids and reasons to control them Rev last labs with pt Rev low sat fat diet in detail LDL 100

## 2023-10-11 DIAGNOSIS — E559 Vitamin D deficiency, unspecified: Secondary | ICD-10-CM | POA: Insufficient documentation

## 2023-10-11 NOTE — Assessment & Plan Note (Signed)
 Last vitamin D  Lab Results  Component Value Date   VD25OH 27.19 (L) 09/24/2023   Encouraged to start ca plus D 2000 international units daily for bone and overall health

## 2023-10-23 DIAGNOSIS — M9902 Segmental and somatic dysfunction of thoracic region: Secondary | ICD-10-CM | POA: Diagnosis not present

## 2023-10-23 DIAGNOSIS — M9903 Segmental and somatic dysfunction of lumbar region: Secondary | ICD-10-CM | POA: Diagnosis not present

## 2023-10-23 DIAGNOSIS — M9905 Segmental and somatic dysfunction of pelvic region: Secondary | ICD-10-CM | POA: Diagnosis not present

## 2023-10-23 DIAGNOSIS — M9901 Segmental and somatic dysfunction of cervical region: Secondary | ICD-10-CM | POA: Diagnosis not present

## 2023-10-25 DIAGNOSIS — M9903 Segmental and somatic dysfunction of lumbar region: Secondary | ICD-10-CM | POA: Diagnosis not present

## 2023-10-25 DIAGNOSIS — M9902 Segmental and somatic dysfunction of thoracic region: Secondary | ICD-10-CM | POA: Diagnosis not present

## 2023-10-25 DIAGNOSIS — M9905 Segmental and somatic dysfunction of pelvic region: Secondary | ICD-10-CM | POA: Diagnosis not present

## 2023-10-25 DIAGNOSIS — M9901 Segmental and somatic dysfunction of cervical region: Secondary | ICD-10-CM | POA: Diagnosis not present

## 2023-11-13 DIAGNOSIS — M9905 Segmental and somatic dysfunction of pelvic region: Secondary | ICD-10-CM | POA: Diagnosis not present

## 2023-11-13 DIAGNOSIS — M9902 Segmental and somatic dysfunction of thoracic region: Secondary | ICD-10-CM | POA: Diagnosis not present

## 2023-11-13 DIAGNOSIS — M9901 Segmental and somatic dysfunction of cervical region: Secondary | ICD-10-CM | POA: Diagnosis not present

## 2023-11-13 DIAGNOSIS — M9903 Segmental and somatic dysfunction of lumbar region: Secondary | ICD-10-CM | POA: Diagnosis not present

## 2023-11-20 DIAGNOSIS — M9901 Segmental and somatic dysfunction of cervical region: Secondary | ICD-10-CM | POA: Diagnosis not present

## 2023-11-20 DIAGNOSIS — M9902 Segmental and somatic dysfunction of thoracic region: Secondary | ICD-10-CM | POA: Diagnosis not present

## 2023-11-20 DIAGNOSIS — M9903 Segmental and somatic dysfunction of lumbar region: Secondary | ICD-10-CM | POA: Diagnosis not present

## 2023-11-20 DIAGNOSIS — M9905 Segmental and somatic dysfunction of pelvic region: Secondary | ICD-10-CM | POA: Diagnosis not present

## 2023-11-27 ENCOUNTER — Encounter: Payer: Self-pay | Admitting: Family Medicine

## 2023-11-27 ENCOUNTER — Telehealth: Payer: Self-pay | Admitting: *Deleted

## 2023-11-27 DIAGNOSIS — E2839 Other primary ovarian failure: Secondary | ICD-10-CM | POA: Insufficient documentation

## 2023-11-27 NOTE — Telephone Encounter (Signed)
The order is in  She can call to schedule 

## 2023-11-27 NOTE — Telephone Encounter (Signed)
 Sent mychart letting pt know  ?

## 2023-11-27 NOTE — Telephone Encounter (Signed)
 Pt sent a message saying:  Hi Dr Randeen, I found out my insurance covers a DEXA scan and is covered at 100%. Even though they don't require a referral, [facility] requires an order be placed from you. Would you mind sending this to them? BTW, I have my mammogram scheduled for 7/29.  Thanks, Rebecca Peters

## 2023-12-03 DIAGNOSIS — M9902 Segmental and somatic dysfunction of thoracic region: Secondary | ICD-10-CM | POA: Diagnosis not present

## 2023-12-03 DIAGNOSIS — M9903 Segmental and somatic dysfunction of lumbar region: Secondary | ICD-10-CM | POA: Diagnosis not present

## 2023-12-03 DIAGNOSIS — M9901 Segmental and somatic dysfunction of cervical region: Secondary | ICD-10-CM | POA: Diagnosis not present

## 2023-12-03 DIAGNOSIS — M9905 Segmental and somatic dysfunction of pelvic region: Secondary | ICD-10-CM | POA: Diagnosis not present

## 2023-12-09 ENCOUNTER — Encounter: Payer: Self-pay | Admitting: Family Medicine

## 2023-12-11 DIAGNOSIS — M9903 Segmental and somatic dysfunction of lumbar region: Secondary | ICD-10-CM | POA: Diagnosis not present

## 2023-12-11 DIAGNOSIS — M9901 Segmental and somatic dysfunction of cervical region: Secondary | ICD-10-CM | POA: Diagnosis not present

## 2023-12-11 DIAGNOSIS — M9905 Segmental and somatic dysfunction of pelvic region: Secondary | ICD-10-CM | POA: Diagnosis not present

## 2023-12-11 DIAGNOSIS — M9902 Segmental and somatic dysfunction of thoracic region: Secondary | ICD-10-CM | POA: Diagnosis not present

## 2023-12-15 ENCOUNTER — Other Ambulatory Visit: Payer: Self-pay | Admitting: Medical Genetics

## 2023-12-16 ENCOUNTER — Ambulatory Visit
Admission: RE | Admit: 2023-12-16 | Discharge: 2023-12-16 | Disposition: A | Discharge status: Home / Self Care | Source: Ambulatory Visit | Attending: Family Medicine | Admitting: Family Medicine

## 2023-12-16 DIAGNOSIS — Z1231 Encounter for screening mammogram for malignant neoplasm of breast: Secondary | ICD-10-CM | POA: Diagnosis not present

## 2023-12-18 ENCOUNTER — Ambulatory Visit: Payer: Self-pay | Admitting: Family Medicine

## 2023-12-18 DIAGNOSIS — M9905 Segmental and somatic dysfunction of pelvic region: Secondary | ICD-10-CM | POA: Diagnosis not present

## 2023-12-18 DIAGNOSIS — M9901 Segmental and somatic dysfunction of cervical region: Secondary | ICD-10-CM | POA: Diagnosis not present

## 2023-12-18 DIAGNOSIS — M9903 Segmental and somatic dysfunction of lumbar region: Secondary | ICD-10-CM | POA: Diagnosis not present

## 2023-12-18 DIAGNOSIS — M9902 Segmental and somatic dysfunction of thoracic region: Secondary | ICD-10-CM | POA: Diagnosis not present

## 2023-12-26 ENCOUNTER — Other Ambulatory Visit (HOSPITAL_COMMUNITY): Payer: Self-pay

## 2023-12-26 DIAGNOSIS — M9901 Segmental and somatic dysfunction of cervical region: Secondary | ICD-10-CM | POA: Diagnosis not present

## 2023-12-26 DIAGNOSIS — M9902 Segmental and somatic dysfunction of thoracic region: Secondary | ICD-10-CM | POA: Diagnosis not present

## 2023-12-26 DIAGNOSIS — M9903 Segmental and somatic dysfunction of lumbar region: Secondary | ICD-10-CM | POA: Diagnosis not present

## 2023-12-26 DIAGNOSIS — M9905 Segmental and somatic dysfunction of pelvic region: Secondary | ICD-10-CM | POA: Diagnosis not present

## 2024-01-05 DIAGNOSIS — M9901 Segmental and somatic dysfunction of cervical region: Secondary | ICD-10-CM | POA: Diagnosis not present

## 2024-01-05 DIAGNOSIS — M9903 Segmental and somatic dysfunction of lumbar region: Secondary | ICD-10-CM | POA: Diagnosis not present

## 2024-01-05 DIAGNOSIS — M9905 Segmental and somatic dysfunction of pelvic region: Secondary | ICD-10-CM | POA: Diagnosis not present

## 2024-01-05 DIAGNOSIS — M9902 Segmental and somatic dysfunction of thoracic region: Secondary | ICD-10-CM | POA: Diagnosis not present

## 2024-01-07 ENCOUNTER — Ambulatory Visit
Admission: RE | Admit: 2024-01-07 | Discharge: 2024-01-07 | Disposition: A | Discharge status: Home / Self Care | Source: Ambulatory Visit | Attending: Family Medicine | Admitting: Family Medicine

## 2024-01-07 DIAGNOSIS — E2839 Other primary ovarian failure: Secondary | ICD-10-CM | POA: Diagnosis not present

## 2024-01-07 DIAGNOSIS — N959 Unspecified menopausal and perimenopausal disorder: Secondary | ICD-10-CM | POA: Diagnosis not present

## 2024-01-08 ENCOUNTER — Ambulatory Visit: Payer: Self-pay | Admitting: Family Medicine

## 2024-01-08 DIAGNOSIS — M9905 Segmental and somatic dysfunction of pelvic region: Secondary | ICD-10-CM | POA: Diagnosis not present

## 2024-01-08 DIAGNOSIS — M9901 Segmental and somatic dysfunction of cervical region: Secondary | ICD-10-CM | POA: Diagnosis not present

## 2024-01-08 DIAGNOSIS — M9902 Segmental and somatic dysfunction of thoracic region: Secondary | ICD-10-CM | POA: Diagnosis not present

## 2024-01-08 DIAGNOSIS — M9903 Segmental and somatic dysfunction of lumbar region: Secondary | ICD-10-CM | POA: Diagnosis not present

## 2024-01-20 DIAGNOSIS — M9903 Segmental and somatic dysfunction of lumbar region: Secondary | ICD-10-CM | POA: Diagnosis not present

## 2024-01-20 DIAGNOSIS — M9905 Segmental and somatic dysfunction of pelvic region: Secondary | ICD-10-CM | POA: Diagnosis not present

## 2024-01-20 DIAGNOSIS — M9902 Segmental and somatic dysfunction of thoracic region: Secondary | ICD-10-CM | POA: Diagnosis not present

## 2024-01-20 DIAGNOSIS — M9901 Segmental and somatic dysfunction of cervical region: Secondary | ICD-10-CM | POA: Diagnosis not present

## 2024-01-27 DIAGNOSIS — M9902 Segmental and somatic dysfunction of thoracic region: Secondary | ICD-10-CM | POA: Diagnosis not present

## 2024-01-27 DIAGNOSIS — M9905 Segmental and somatic dysfunction of pelvic region: Secondary | ICD-10-CM | POA: Diagnosis not present

## 2024-01-27 DIAGNOSIS — M9901 Segmental and somatic dysfunction of cervical region: Secondary | ICD-10-CM | POA: Diagnosis not present

## 2024-01-27 DIAGNOSIS — M9903 Segmental and somatic dysfunction of lumbar region: Secondary | ICD-10-CM | POA: Diagnosis not present

## 2024-02-17 DIAGNOSIS — M9902 Segmental and somatic dysfunction of thoracic region: Secondary | ICD-10-CM | POA: Diagnosis not present

## 2024-02-17 DIAGNOSIS — M9905 Segmental and somatic dysfunction of pelvic region: Secondary | ICD-10-CM | POA: Diagnosis not present

## 2024-02-17 DIAGNOSIS — M9901 Segmental and somatic dysfunction of cervical region: Secondary | ICD-10-CM | POA: Diagnosis not present

## 2024-02-17 DIAGNOSIS — M9903 Segmental and somatic dysfunction of lumbar region: Secondary | ICD-10-CM | POA: Diagnosis not present

## 2024-03-08 ENCOUNTER — Other Ambulatory Visit: Payer: Self-pay | Admitting: Medical Genetics

## 2024-03-08 DIAGNOSIS — Z006 Encounter for examination for normal comparison and control in clinical research program: Secondary | ICD-10-CM

## 2024-03-16 DIAGNOSIS — M9903 Segmental and somatic dysfunction of lumbar region: Secondary | ICD-10-CM | POA: Diagnosis not present

## 2024-03-16 DIAGNOSIS — M9901 Segmental and somatic dysfunction of cervical region: Secondary | ICD-10-CM | POA: Diagnosis not present

## 2024-03-16 DIAGNOSIS — M9902 Segmental and somatic dysfunction of thoracic region: Secondary | ICD-10-CM | POA: Diagnosis not present

## 2024-03-16 DIAGNOSIS — M9905 Segmental and somatic dysfunction of pelvic region: Secondary | ICD-10-CM | POA: Diagnosis not present

## 2024-04-20 DIAGNOSIS — M9901 Segmental and somatic dysfunction of cervical region: Secondary | ICD-10-CM | POA: Diagnosis not present

## 2024-04-20 DIAGNOSIS — M9905 Segmental and somatic dysfunction of pelvic region: Secondary | ICD-10-CM | POA: Diagnosis not present

## 2024-04-20 DIAGNOSIS — M9902 Segmental and somatic dysfunction of thoracic region: Secondary | ICD-10-CM | POA: Diagnosis not present

## 2024-04-20 DIAGNOSIS — M9903 Segmental and somatic dysfunction of lumbar region: Secondary | ICD-10-CM | POA: Diagnosis not present

## 2024-05-03 DIAGNOSIS — M9902 Segmental and somatic dysfunction of thoracic region: Secondary | ICD-10-CM | POA: Diagnosis not present

## 2024-05-03 DIAGNOSIS — M9905 Segmental and somatic dysfunction of pelvic region: Secondary | ICD-10-CM | POA: Diagnosis not present

## 2024-05-03 DIAGNOSIS — M9901 Segmental and somatic dysfunction of cervical region: Secondary | ICD-10-CM | POA: Diagnosis not present

## 2024-05-03 DIAGNOSIS — M9903 Segmental and somatic dysfunction of lumbar region: Secondary | ICD-10-CM | POA: Diagnosis not present

## 2024-09-21 ENCOUNTER — Encounter: Admitting: Dermatology
